# Patient Record
Sex: Male | Born: 1935 | Race: White | Hispanic: No | Marital: Married | Smoking: Former smoker
Health system: Southern US, Community
[De-identification: ages and names within clinical notes are randomized; demographics above are authoritative.]

## PROBLEM LIST (undated history)

## (undated) DIAGNOSIS — I10 Essential (primary) hypertension: Secondary | ICD-10-CM

## (undated) DIAGNOSIS — A498 Other bacterial infections of unspecified site: Secondary | ICD-10-CM

## (undated) DIAGNOSIS — E538 Deficiency of other specified B group vitamins: Secondary | ICD-10-CM

## (undated) DIAGNOSIS — E039 Hypothyroidism, unspecified: Secondary | ICD-10-CM

## (undated) DIAGNOSIS — M1990 Unspecified osteoarthritis, unspecified site: Secondary | ICD-10-CM

## (undated) DIAGNOSIS — E7800 Pure hypercholesterolemia, unspecified: Secondary | ICD-10-CM

## (undated) DIAGNOSIS — C801 Malignant (primary) neoplasm, unspecified: Secondary | ICD-10-CM

## (undated) DIAGNOSIS — K219 Gastro-esophageal reflux disease without esophagitis: Secondary | ICD-10-CM

## (undated) DIAGNOSIS — F32A Depression, unspecified: Secondary | ICD-10-CM

## (undated) DIAGNOSIS — F329 Major depressive disorder, single episode, unspecified: Secondary | ICD-10-CM

## (undated) DIAGNOSIS — B0229 Other postherpetic nervous system involvement: Secondary | ICD-10-CM

## (undated) DIAGNOSIS — B029 Zoster without complications: Secondary | ICD-10-CM

## (undated) HISTORY — PX: OTHER SURGICAL HISTORY: SHX169

## (undated) HISTORY — PX: FECAL TRANSPLANT: SHX6383

## (undated) HISTORY — PX: CORONARY ARTERY BYPASS GRAFT: SHX141

## (undated) HISTORY — PX: FRACTURE SURGERY: SHX138

## (undated) HISTORY — PX: TONSILLECTOMY: SUR1361

---

## 2011-04-23 ENCOUNTER — Ambulatory Visit: Payer: Self-pay | Admitting: Pain Medicine

## 2012-03-28 ENCOUNTER — Ambulatory Visit: Payer: Self-pay | Admitting: Oncology

## 2012-04-25 ENCOUNTER — Ambulatory Visit: Payer: Self-pay | Admitting: Oncology

## 2012-06-30 ENCOUNTER — Ambulatory Visit: Payer: Self-pay | Admitting: Oncology

## 2012-06-30 LAB — CBC CANCER CENTER
Basophil #: 0.1 x10 3/mm (ref 0.0–0.1)
Basophil %: 0.8 %
Eosinophil #: 0.3 x10 3/mm (ref 0.0–0.7)
HCT: 35.8 % — ABNORMAL LOW (ref 40.0–52.0)
Lymphocyte #: 1.3 x10 3/mm (ref 1.0–3.6)
MCH: 28.2 pg (ref 26.0–34.0)
MCV: 86 fL (ref 80–100)
Monocyte #: 0.5 x10 3/mm (ref 0.2–1.0)
Neutrophil %: 70.2 %
Platelet: 236 x10 3/mm (ref 150–440)
RBC: 4.15 10*6/uL — ABNORMAL LOW (ref 4.40–5.90)
RDW: 14.2 % (ref 11.5–14.5)

## 2012-06-30 LAB — BASIC METABOLIC PANEL
Anion Gap: 6 — ABNORMAL LOW (ref 7–16)
BUN: 23 mg/dL — ABNORMAL HIGH (ref 7–18)
EGFR (African American): >60
EGFR (Non-African Amer.): >60
Glucose: 89 mg/dL (ref 65–99)
Osmolality: 283 (ref 275–301)
Potassium: 4.5 mmol/L (ref 3.5–5.1)
Sodium: 140 mmol/L (ref 136–145)

## 2012-07-01 LAB — URINE IEP, RANDOM

## 2012-07-01 LAB — KAPPA/LAMBDA FREE LIGHT CHAINS (ARMC)

## 2012-07-01 LAB — PROT IMMUNOELECTROPHORES(ARMC)

## 2012-07-26 ENCOUNTER — Ambulatory Visit: Payer: Self-pay | Admitting: Oncology

## 2014-05-10 ENCOUNTER — Ambulatory Visit: Payer: Self-pay | Admitting: Otolaryngology

## 2014-05-31 ENCOUNTER — Ambulatory Visit: Payer: Self-pay | Admitting: Otolaryngology

## 2014-06-08 ENCOUNTER — Ambulatory Visit: Payer: Self-pay | Admitting: Family Medicine

## 2014-07-19 DIAGNOSIS — F329 Major depressive disorder, single episode, unspecified: Secondary | ICD-10-CM | POA: Insufficient documentation

## 2014-07-19 DIAGNOSIS — I10 Essential (primary) hypertension: Secondary | ICD-10-CM | POA: Insufficient documentation

## 2014-07-19 DIAGNOSIS — F32A Depression, unspecified: Secondary | ICD-10-CM | POA: Insufficient documentation

## 2014-07-19 DIAGNOSIS — E785 Hyperlipidemia, unspecified: Secondary | ICD-10-CM | POA: Insufficient documentation

## 2014-08-21 DIAGNOSIS — M5136 Other intervertebral disc degeneration, lumbar region: Secondary | ICD-10-CM | POA: Insufficient documentation

## 2014-08-22 DIAGNOSIS — M62830 Muscle spasm of back: Secondary | ICD-10-CM | POA: Insufficient documentation

## 2014-09-17 DIAGNOSIS — J449 Chronic obstructive pulmonary disease, unspecified: Secondary | ICD-10-CM | POA: Insufficient documentation

## 2015-06-20 ENCOUNTER — Encounter: Payer: Self-pay | Admitting: *Deleted

## 2015-06-21 ENCOUNTER — Ambulatory Visit
Admission: RE | Admit: 2015-06-21 | Discharge: 2015-06-21 | Disposition: A | Discharge status: Home / Self Care | Payer: Medicare Other | Source: Ambulatory Visit | Attending: Gastroenterology | Admitting: Gastroenterology

## 2015-06-21 ENCOUNTER — Encounter
Admission: RE | Disposition: A | Discharge status: Home / Self Care | Payer: Self-pay | Source: Ambulatory Visit | Attending: Gastroenterology

## 2015-06-21 ENCOUNTER — Ambulatory Visit: Payer: Medicare Other | Admitting: Anesthesiology

## 2015-06-21 DIAGNOSIS — Z85828 Personal history of other malignant neoplasm of skin: Secondary | ICD-10-CM | POA: Diagnosis not present

## 2015-06-21 DIAGNOSIS — I10 Essential (primary) hypertension: Secondary | ICD-10-CM | POA: Diagnosis not present

## 2015-06-21 DIAGNOSIS — Z951 Presence of aortocoronary bypass graft: Secondary | ICD-10-CM | POA: Diagnosis not present

## 2015-06-21 DIAGNOSIS — E538 Deficiency of other specified B group vitamins: Secondary | ICD-10-CM | POA: Insufficient documentation

## 2015-06-21 DIAGNOSIS — E890 Postprocedural hypothyroidism: Secondary | ICD-10-CM | POA: Diagnosis not present

## 2015-06-21 DIAGNOSIS — Z85819 Personal history of malignant neoplasm of unspecified site of lip, oral cavity, and pharynx: Secondary | ICD-10-CM | POA: Diagnosis not present

## 2015-06-21 DIAGNOSIS — M19071 Primary osteoarthritis, right ankle and foot: Secondary | ICD-10-CM | POA: Insufficient documentation

## 2015-06-21 DIAGNOSIS — E780 Pure hypercholesterolemia: Secondary | ICD-10-CM | POA: Diagnosis not present

## 2015-06-21 DIAGNOSIS — M19072 Primary osteoarthritis, left ankle and foot: Secondary | ICD-10-CM | POA: Insufficient documentation

## 2015-06-21 DIAGNOSIS — K219 Gastro-esophageal reflux disease without esophagitis: Secondary | ICD-10-CM | POA: Insufficient documentation

## 2015-06-21 DIAGNOSIS — Z79899 Other long term (current) drug therapy: Secondary | ICD-10-CM | POA: Insufficient documentation

## 2015-06-21 DIAGNOSIS — Z1211 Encounter for screening for malignant neoplasm of colon: Secondary | ICD-10-CM | POA: Diagnosis present

## 2015-06-21 DIAGNOSIS — Z538 Procedure and treatment not carried out for other reasons: Secondary | ICD-10-CM | POA: Insufficient documentation

## 2015-06-21 DIAGNOSIS — Z87891 Personal history of nicotine dependence: Secondary | ICD-10-CM | POA: Diagnosis not present

## 2015-06-21 DIAGNOSIS — F329 Major depressive disorder, single episode, unspecified: Secondary | ICD-10-CM | POA: Diagnosis not present

## 2015-06-21 DIAGNOSIS — Z885 Allergy status to narcotic agent status: Secondary | ICD-10-CM | POA: Insufficient documentation

## 2015-06-21 HISTORY — DX: Deficiency of other specified B group vitamins: E53.8

## 2015-06-21 HISTORY — DX: Depression, unspecified: F32.A

## 2015-06-21 HISTORY — DX: Unspecified osteoarthritis, unspecified site: M19.90

## 2015-06-21 HISTORY — DX: Malignant (primary) neoplasm, unspecified: C80.1

## 2015-06-21 HISTORY — DX: Zoster without complications: B02.9

## 2015-06-21 HISTORY — DX: Gastro-esophageal reflux disease without esophagitis: K21.9

## 2015-06-21 HISTORY — DX: Essential (primary) hypertension: I10

## 2015-06-21 HISTORY — DX: Other postherpetic nervous system involvement: B02.29

## 2015-06-21 HISTORY — DX: Pure hypercholesterolemia, unspecified: E78.00

## 2015-06-21 HISTORY — DX: Major depressive disorder, single episode, unspecified: F32.9

## 2015-06-21 HISTORY — DX: Hypothyroidism, unspecified: E03.9

## 2015-06-21 SURGERY — COLONOSCOPY WITH PROPOFOL
Anesthesia: General

## 2015-06-21 MED ORDER — PROPOFOL 10 MG/ML IV BOLUS
INTRAVENOUS | Status: DC | PRN
  Administered 2015-06-21: 70 mg via INTRAVENOUS

## 2015-06-21 MED ORDER — EPHEDRINE SULFATE 50 MG/ML IJ SOLN
INTRAMUSCULAR | Status: DC | PRN
  Administered 2015-06-21: 10 mg via INTRAVENOUS

## 2015-06-21 MED ORDER — PROPOFOL INFUSION 10 MG/ML OPTIME
INTRAVENOUS | Status: DC | PRN
  Administered 2015-06-21: 140 ug/kg/min via INTRAVENOUS

## 2015-06-21 MED ORDER — SODIUM CHLORIDE 0.9 % IV SOLN
INTRAVENOUS | Status: DC
  Administered 2015-06-21: 12:00:00 via INTRAVENOUS

## 2015-06-21 MED ORDER — CARVEDILOL 6.25 MG PO TABS
ORAL_TABLET | ORAL | Status: AC
  Administered 2015-06-21: 3.125 mg via ORAL
  Filled 2015-06-21: qty 1

## 2015-06-21 MED ORDER — GLYCOPYRROLATE 0.2 MG/ML IJ SOLN
INTRAMUSCULAR | Status: DC | PRN
  Administered 2015-06-21 (×2): 0.1 mg via INTRAVENOUS

## 2015-06-21 MED ORDER — SODIUM CHLORIDE 0.9 % IV SOLN
INTRAVENOUS | Status: DC
  Administered 2015-06-21 (×2): via INTRAVENOUS

## 2015-06-21 MED ORDER — CARVEDILOL 6.25 MG PO TABS
3.1250 mg | ORAL_TABLET | Freq: Once | ORAL | Status: AC
  Administered 2015-06-21: 3.125 mg via ORAL

## 2015-06-21 NOTE — Transfer of Care (Signed)
Immediate Anesthesia Transfer of Care Note  Patient: Samuel Ortiz  Procedure(s) Performed: Procedure(s): COLONOSCOPY WITH PROPOFOL (N/A)  Patient Location: PACU  Anesthesia Type:general  Level of Consciousness: sedated  Airway & Oxygen Therapy: Patient Spontanous Breathing and Patient connected to nasal cannula oxygen  Post-op Assessment: Report given to RN  Post vital signs: Reviewed and stable  Last Vitals:  Filed Vitals:   06/21/15 1155  BP: 90/46  Pulse: 51  Temp: 35.8 C  Resp: 14    Complications: No apparent anesthesia complications

## 2015-06-21 NOTE — Anesthesia Postprocedure Evaluation (Signed)
  Anesthesia Post-op Note  Patient: Samuel Ortiz  Procedure(s) Performed: Procedure(s): COLONOSCOPY WITH PROPOFOL (N/A)  Anesthesia type:General  Patient location: PACU  Post pain: Pain level controlled  Post assessment: Post-op Vital signs reviewed, Patient's Cardiovascular Status Stable, Respiratory Function Stable, Patent Airway and No signs of Nausea or vomiting  Post vital signs: Reviewed and stable  Last Vitals:  Filed Vitals:   06/21/15 1155  BP: 90/46  Pulse: 51  Temp: 35.8 C  Resp: 14    Level of consciousness: awake, alert  and patient cooperative  Complications: No apparent anesthesia complications

## 2015-06-21 NOTE — Op Note (Signed)
[facility] Gastroenterology Patient Name: Samuel Ortiz Procedure Date: 06/21/2015 11:34 AM MRN: [id number] Account #: [id number] Date of Birth: 1934/10/17 Admit Type: Outpatient Age: 81 Room: [facility] ENDO ROOM 3 Gender: Male Note Status: Finalized Procedure:         Colonoscopy Indications:       Screening for colorectal malignant neoplasm Providers:         Lollie Sails, MD Referring MD:      Shirline Frees (Referring MD) Medicines:         Monitored Anesthesia Care Complications:     No immediate complications. Procedure:         Pre-Anesthesia Assessment:                    - ASA Grade Assessment: III - A patient with severe                     systemic disease.                    After obtaining informed consent, the colonoscope was                     passed under direct vision. Throughout the procedure, the                     patient's blood pressure, pulse, and oxygen saturations                     were monitored continuously. The Colonoscope was                     introduced through the anus with the intention of                     advancing to the cecum. The scope was advanced to the                     sigmoid colon before the procedure was aborted.                     Medications were given. The colonoscopy was extremely                     difficult due to poor bowel prep with stool present. The                     quality of the bowel preparation was poor. Findings:      poor prep, unable to clear adequately for examination.      The digital rectal exam was normal. Impression:        - Preparation of the colon was poor.                    - No specimens collected. Recommendation:    - repeat preparation and reschedule Procedure Code(s): --- Professional ---                    [phone], Sigmoidoscopy, flexible; diagnostic, including                     collection of specimen(s) by brushing or washing, when                     performed  (separate procedure) Diagnosis Code(s): --- Professional ---  V76.51, Special screening for malignant neoplasms of colon CPT copyright 2014 American Medical Association. All rights reserved. The codes documented in this report are preliminary and upon coder review may  be revised to meet current compliance requirements. Lollie Sails, MD 06/21/2015 11:47:45 AM This report has been signed electronically. Number of Addenda: 0 Note Initiated On: 06/21/2015 11:34 AM Total Procedure Duration: 0 hours 1 minute 13 seconds       [facility]

## 2015-06-21 NOTE — H&P (Signed)
Outpatient short stay form Pre-procedure 06/21/2015 11:32 AM Samuel Sails MD  Primary Physician: D C. Virk  Reason for visit:  Colonoscopy  History of present illness:  Patient is a 81 year old male presenting for a screening colonoscopy. He is not sure of the exact date of his last colonoscopy.  He tolerated his prep well. He denies use of any aspirin  products. He takes no anticoagulation medications.    Current facility-administered medications:  .  0.9 %  sodium chloride infusion, , Intravenous, Continuous, Samuel Sails, MD, Last Rate: 20 mL/hr at 06/21/15 1055 .  0.9 %  sodium chloride infusion, , Intravenous, Continuous, Samuel Sails, MD  Prescriptions prior to admission  Medication Sig Dispense Refill Last Dose  . carvedilol (COREG) 3.125 MG tablet Take 3.125 mg by mouth 2 (two) times daily with a meal.   06/19/2015  . ferrous gluconate (FERGON) 324 MG tablet Take 324 mg by mouth daily with breakfast.   06/19/2015  . gabapentin (NEURONTIN) 300 MG capsule Take 300 mg by mouth 2 (two) times daily.   06/21/2015 at 00600  . HYDROcodone-acetaminophen (NORCO/VICODIN) 5-325 MG per tablet Take 1 tablet by mouth every 6 (six) hours as needed for moderate pain.   06/21/2015 at 0600  . levothyroxine (SYNTHROID, LEVOTHROID) 200 MCG tablet Take 200 mcg by mouth daily before breakfast.   06/19/2015  . lisinopril (PRINIVIL,ZESTRIL) 20 MG tablet Take 20 mg by mouth daily.   06/19/2015  . meloxicam (MOBIC) 7.5 MG tablet Take 7.5 mg by mouth daily.   06/19/2015  . omega-3 acid ethyl esters (LOVAZA) 1 G capsule Take 1 g by mouth daily.   06/19/2015  . sertraline (ZOLOFT) 100 MG tablet Take 100 mg by mouth daily.   06/19/2015  . simvastatin (ZOCOR) 20 MG tablet Take 20 mg by mouth daily.   06/19/2015  . Testosterone (AXIRON) 30 MG/ACT SOLN Place onto the skin.     Marland Kitchen vitamin B-12 (CYANOCOBALAMIN) 1000 MCG tablet Take 1,000 mcg by mouth daily.   06/19/2015     Allergies  Allergen Reactions   . Morphine And Related      Past Medical History  Diagnosis Date  . Cancer   . Hypothyroidism   . GERD (gastroesophageal reflux disease)   . Hypertension   . Arthritis   . Depression   . Shingles   . Vitamin B 12 deficiency   . Hypercholesterolemia   . Postherpetic neuralgia     Review of systems:      Physical Exam    Heart and lungs: Regular rate and rhythm, lungs are bilaterally clear    HEENT: Normocephalic atraumatic eyes are anicteric    Other:     Pertinant exam for procedure: Soft nontender nondistended bowel sounds positive normoactive    Planned proceedures: Colonoscopy and indicated procedures I have discussed the risks benefits and complications of procedures to include not limited to bleeding, infection, perforation and the risk of sedation and the patient wishes to proceed.    Samuel Sails, MD Gastroenterology 06/21/2015  11:32 AM

## 2015-06-21 NOTE — Anesthesia Preprocedure Evaluation (Addendum)
Anesthesia Evaluation  Patient identified by MRN, date of birth, ID band Patient awake    Reviewed: Allergy & Precautions, NPO status , Patient's Chart, lab work & pertinent test results  History of Anesthesia Complications Negative for: history of anesthetic complications  Airway Mallampati: III  TM Distance: >3 FB Neck ROM: Full    Dental  (+) Partial Lower, Upper Dentures   Pulmonary former smoker (quit x 54 yrs),          Cardiovascular hypertension, Pt. on medications and Pt. on home beta blockers + CABG     Neuro/Psych Depression  Neuromuscular disease (shingles)    GI/Hepatic GERD-  Medicated and Controlled,  Endo/Other  Hypothyroidism   Renal/GU      Musculoskeletal   Abdominal   Peds  Hematology   Anesthesia Other Findings   Reproductive/Obstetrics                            Anesthesia Physical Anesthesia Plan  ASA: III  Anesthesia Plan: General   Post-op Pain Management:    Induction: Intravenous  Airway Management Planned: Nasal Cannula  Additional Equipment:   Intra-op Plan:   Post-operative Plan:   Informed Consent: I have reviewed the patients History and Physical, chart, labs and discussed the procedure including the risks, benefits and alternatives for the proposed anesthesia with the patient or authorized representative who has indicated his/her understanding and acceptance.     Plan Discussed with:   Anesthesia Plan Comments:         Anesthesia Quick Evaluation

## 2015-06-21 NOTE — OR Nursing (Signed)
Patient procedure canceled due to very poor prep.

## 2015-08-02 ENCOUNTER — Ambulatory Visit: Admission: RE | Admit: 2015-08-02 | Payer: Medicare Other | Source: Ambulatory Visit | Admitting: Gastroenterology

## 2015-08-02 ENCOUNTER — Encounter: Admission: RE | Payer: Self-pay | Source: Ambulatory Visit

## 2015-08-02 SURGERY — COLONOSCOPY WITH PROPOFOL
Anesthesia: General

## 2016-03-21 DIAGNOSIS — I10 Essential (primary) hypertension: Secondary | ICD-10-CM | POA: Insufficient documentation

## 2016-03-21 DIAGNOSIS — J189 Pneumonia, unspecified organism: Secondary | ICD-10-CM | POA: Insufficient documentation

## 2016-03-21 DIAGNOSIS — A419 Sepsis, unspecified organism: Secondary | ICD-10-CM | POA: Insufficient documentation

## 2016-03-22 DIAGNOSIS — Z8719 Personal history of other diseases of the digestive system: Secondary | ICD-10-CM | POA: Insufficient documentation

## 2016-03-25 DIAGNOSIS — R6520 Severe sepsis without septic shock: Secondary | ICD-10-CM

## 2016-03-25 DIAGNOSIS — J189 Pneumonia, unspecified organism: Secondary | ICD-10-CM | POA: Insufficient documentation

## 2016-03-25 DIAGNOSIS — A419 Sepsis, unspecified organism: Secondary | ICD-10-CM | POA: Insufficient documentation

## 2016-03-25 DIAGNOSIS — J181 Lobar pneumonia, unspecified organism: Secondary | ICD-10-CM

## 2016-04-03 ENCOUNTER — Inpatient Hospital Stay
Admission: EM | Admit: 2016-04-03 | Discharge: 2016-04-09 | DRG: 871 | Disposition: A | Discharge status: Skilled Nursing Facility | Payer: Medicare Other | Attending: Internal Medicine | Admitting: Internal Medicine

## 2016-04-03 ENCOUNTER — Emergency Department: Payer: Medicare Other

## 2016-04-03 DIAGNOSIS — R55 Syncope and collapse: Secondary | ICD-10-CM | POA: Diagnosis present

## 2016-04-03 DIAGNOSIS — K219 Gastro-esophageal reflux disease without esophagitis: Secondary | ICD-10-CM | POA: Diagnosis present

## 2016-04-03 DIAGNOSIS — R9431 Abnormal electrocardiogram [ECG] [EKG]: Secondary | ICD-10-CM | POA: Diagnosis present

## 2016-04-03 DIAGNOSIS — Z66 Do not resuscitate: Secondary | ICD-10-CM | POA: Diagnosis present

## 2016-04-03 DIAGNOSIS — E860 Dehydration: Secondary | ICD-10-CM | POA: Diagnosis present

## 2016-04-03 DIAGNOSIS — Z7982 Long term (current) use of aspirin: Secondary | ICD-10-CM | POA: Diagnosis not present

## 2016-04-03 DIAGNOSIS — B0229 Other postherpetic nervous system involvement: Secondary | ICD-10-CM | POA: Diagnosis present

## 2016-04-03 DIAGNOSIS — N179 Acute kidney failure, unspecified: Secondary | ICD-10-CM | POA: Diagnosis present

## 2016-04-03 DIAGNOSIS — E039 Hypothyroidism, unspecified: Secondary | ICD-10-CM

## 2016-04-03 DIAGNOSIS — I959 Hypotension, unspecified: Secondary | ICD-10-CM | POA: Diagnosis not present

## 2016-04-03 DIAGNOSIS — Z79899 Other long term (current) drug therapy: Secondary | ICD-10-CM | POA: Diagnosis not present

## 2016-04-03 DIAGNOSIS — Z85819 Personal history of malignant neoplasm of unspecified site of lip, oral cavity, and pharynx: Secondary | ICD-10-CM

## 2016-04-03 DIAGNOSIS — I248 Other forms of acute ischemic heart disease: Secondary | ICD-10-CM | POA: Diagnosis present

## 2016-04-03 DIAGNOSIS — F329 Major depressive disorder, single episode, unspecified: Secondary | ICD-10-CM | POA: Diagnosis present

## 2016-04-03 DIAGNOSIS — Z515 Encounter for palliative care: Secondary | ICD-10-CM | POA: Diagnosis present

## 2016-04-03 DIAGNOSIS — Z951 Presence of aortocoronary bypass graft: Secondary | ICD-10-CM | POA: Diagnosis not present

## 2016-04-03 DIAGNOSIS — R5381 Other malaise: Secondary | ICD-10-CM | POA: Diagnosis present

## 2016-04-03 DIAGNOSIS — Z87891 Personal history of nicotine dependence: Secondary | ICD-10-CM | POA: Diagnosis not present

## 2016-04-03 DIAGNOSIS — Z923 Personal history of irradiation: Secondary | ICD-10-CM | POA: Diagnosis not present

## 2016-04-03 DIAGNOSIS — A414 Sepsis due to anaerobes: Principal | ICD-10-CM | POA: Diagnosis present

## 2016-04-03 DIAGNOSIS — R778 Other specified abnormalities of plasma proteins: Secondary | ICD-10-CM | POA: Diagnosis present

## 2016-04-03 DIAGNOSIS — I10 Essential (primary) hypertension: Secondary | ICD-10-CM | POA: Diagnosis present

## 2016-04-03 DIAGNOSIS — J690 Pneumonitis due to inhalation of food and vomit: Secondary | ICD-10-CM | POA: Diagnosis present

## 2016-04-03 DIAGNOSIS — A047 Enterocolitis due to Clostridium difficile: Secondary | ICD-10-CM | POA: Diagnosis present

## 2016-04-03 DIAGNOSIS — K922 Gastrointestinal hemorrhage, unspecified: Secondary | ICD-10-CM | POA: Diagnosis present

## 2016-04-03 DIAGNOSIS — M25519 Pain in unspecified shoulder: Secondary | ICD-10-CM

## 2016-04-03 DIAGNOSIS — M19011 Primary osteoarthritis, right shoulder: Secondary | ICD-10-CM | POA: Diagnosis present

## 2016-04-03 DIAGNOSIS — Z85810 Personal history of malignant neoplasm of tongue: Secondary | ICD-10-CM | POA: Diagnosis not present

## 2016-04-03 DIAGNOSIS — E7800 Pure hypercholesterolemia, unspecified: Secondary | ICD-10-CM | POA: Diagnosis present

## 2016-04-03 DIAGNOSIS — R1310 Dysphagia, unspecified: Secondary | ICD-10-CM | POA: Diagnosis present

## 2016-04-03 DIAGNOSIS — A0472 Enterocolitis due to Clostridium difficile, not specified as recurrent: Secondary | ICD-10-CM | POA: Diagnosis present

## 2016-04-03 DIAGNOSIS — B029 Zoster without complications: Secondary | ICD-10-CM

## 2016-04-03 DIAGNOSIS — R296 Repeated falls: Secondary | ICD-10-CM | POA: Diagnosis present

## 2016-04-03 DIAGNOSIS — R7989 Other specified abnormal findings of blood chemistry: Secondary | ICD-10-CM

## 2016-04-03 DIAGNOSIS — R0902 Hypoxemia: Secondary | ICD-10-CM

## 2016-04-03 DIAGNOSIS — J9601 Acute respiratory failure with hypoxia: Secondary | ICD-10-CM | POA: Diagnosis not present

## 2016-04-03 DIAGNOSIS — A419 Sepsis, unspecified organism: Secondary | ICD-10-CM | POA: Diagnosis not present

## 2016-04-03 LAB — COMPREHENSIVE METABOLIC PANEL
ALBUMIN: 3.2 g/dL — AB (ref 3.5–5.0)
ALT: 12 U/L — ABNORMAL LOW (ref 17–63)
ANION GAP: 9 (ref 5–15)
AST: 18 U/L (ref 15–41)
Alkaline Phosphatase: 16 U/L — ABNORMAL LOW (ref 38–126)
BUN: 18 mg/dL (ref 6–20)
CO2: 24 mmol/L (ref 22–32)
Calcium: 7.8 mg/dL — ABNORMAL LOW (ref 8.9–10.3)
Chloride: 103 mmol/L (ref 101–111)
Creatinine, Ser: 1.19 mg/dL (ref 0.61–1.24)
GFR calc Af Amer: >60 mL/min (ref >60)
GFR calc non Af Amer: 56 mL/min — ABNORMAL LOW (ref >60)
GLUCOSE: 107 mg/dL — AB (ref 65–99)
POTASSIUM: 5 mmol/L (ref 3.5–5.1)
SODIUM: 136 mmol/L (ref 135–145)
Total Bilirubin: 0.6 mg/dL (ref 0.3–1.2)
Total Protein: 7.3 g/dL (ref 6.5–8.1)

## 2016-04-03 LAB — CBC WITH DIFFERENTIAL/PLATELET
Basophils Absolute: 0 10*3/uL (ref 0–0.1)
Basophils Relative: 0 %
Eosinophils Absolute: 0.1 10*3/uL (ref 0–0.7)
Eosinophils Relative: 1 %
HEMATOCRIT: 36.1 % — AB (ref 40.0–52.0)
Hemoglobin: 11.8 g/dL — ABNORMAL LOW (ref 13.0–18.0)
Lymphs Abs: 0.6 10*3/uL — ABNORMAL LOW (ref 1.0–3.6)
MCH: 28 pg (ref 26.0–34.0)
MCHC: 32.6 g/dL (ref 32.0–36.0)
MCV: 85.9 fL (ref 80.0–100.0)
MONO ABS: 0.7 10*3/uL (ref 0.2–1.0)
NEUTROS ABS: 21.1 10*3/uL — AB (ref 1.4–6.5)
Neutrophils Relative %: 93 %
Platelets: 418 10*3/uL (ref 150–440)
RBC: 4.21 MIL/uL — ABNORMAL LOW (ref 4.40–5.90)
RDW: 13.6 % (ref 11.5–14.5)
WBC: 22.6 10*3/uL — ABNORMAL HIGH (ref 3.8–10.6)

## 2016-04-03 LAB — GASTROINTESTINAL PANEL BY PCR, STOOL (REPLACES STOOL CULTURE)
ADENOVIRUS F40/41: NOT DETECTED
ASTROVIRUS: NOT DETECTED
Campylobacter species: NOT DETECTED
Cryptosporidium: NOT DETECTED
Cyclospora cayetanensis: NOT DETECTED
E. COLI O157: NOT DETECTED
ENTAMOEBA HISTOLYTICA: NOT DETECTED
ENTEROPATHOGENIC E COLI (EPEC): NOT DETECTED
ENTEROTOXIGENIC E COLI (ETEC): NOT DETECTED
Enteroaggregative E coli (EAEC): NOT DETECTED
Giardia lamblia: NOT DETECTED
NOROVIRUS GI/GII: NOT DETECTED
Plesimonas shigelloides: NOT DETECTED
Rotavirus A: NOT DETECTED
SAPOVIRUS (I, II, IV, AND V): NOT DETECTED
SHIGA LIKE TOXIN PRODUCING E COLI (STEC): NOT DETECTED
Salmonella species: NOT DETECTED
Shigella/Enteroinvasive E coli (EIEC): NOT DETECTED
VIBRIO CHOLERAE: NOT DETECTED
Vibrio species: NOT DETECTED
Yersinia enterocolitica: NOT DETECTED

## 2016-04-03 LAB — TROPONIN I
Troponin I: 0.05 ng/mL — ABNORMAL HIGH (ref <0.031)
Troponin I: 0.07 ng/mL — ABNORMAL HIGH (ref <0.031)

## 2016-04-03 LAB — LACTIC ACID, PLASMA
LACTIC ACID, VENOUS: 1 mmol/L (ref 0.5–2.0)
Lactic Acid, Venous: 1.3 mmol/L (ref 0.5–2.0)

## 2016-04-03 LAB — MRSA PCR SCREENING: MRSA BY PCR: NEGATIVE

## 2016-04-03 LAB — HEMOGLOBIN AND HEMATOCRIT, BLOOD
HEMATOCRIT: 37.8 % — AB (ref 40.0–52.0)
HEMOGLOBIN: 12.2 g/dL — AB (ref 13.0–18.0)

## 2016-04-03 LAB — C DIFFICILE QUICK SCREEN W PCR REFLEX
C DIFFICILE (CDIFF) INTERP: POSITIVE
C DIFFICILE (CDIFF) TOXIN: POSITIVE — AB
C Diff antigen: POSITIVE — AB

## 2016-04-03 LAB — GLUCOSE, CAPILLARY: GLUCOSE-CAPILLARY: 94 mg/dL (ref 65–99)

## 2016-04-03 LAB — BRAIN NATRIURETIC PEPTIDE: B Natriuretic Peptide: 374 pg/mL — ABNORMAL HIGH (ref 0.0–100.0)

## 2016-04-03 MED ORDER — HYDROCODONE-ACETAMINOPHEN 5-325 MG PO TABS
1.0000 | ORAL_TABLET | Freq: Four times a day (QID) | ORAL | Status: DC | PRN
  Administered 2016-04-05 – 2016-04-09 (×10): 1 via ORAL
  Filled 2016-04-03 (×10): qty 1

## 2016-04-03 MED ORDER — GABAPENTIN 300 MG PO CAPS
900.0000 mg | ORAL_CAPSULE | Freq: Three times a day (TID) | ORAL | Status: DC
  Administered 2016-04-03: 900 mg via ORAL
  Filled 2016-04-03: qty 3

## 2016-04-03 MED ORDER — LISINOPRIL 20 MG PO TABS
20.0000 mg | ORAL_TABLET | Freq: Every day | ORAL | Status: DC
  Filled 2016-04-03: qty 1

## 2016-04-03 MED ORDER — METRONIDAZOLE 250 MG PO TABS
250.0000 mg | ORAL_TABLET | Freq: Three times a day (TID) | ORAL | Status: DC
  Administered 2016-04-04: 250 mg via ORAL
  Filled 2016-04-03 (×2): qty 1

## 2016-04-03 MED ORDER — LEVOTHYROXINE SODIUM 75 MCG PO TABS
200.0000 ug | ORAL_TABLET | Freq: Every day | ORAL | Status: DC
  Administered 2016-04-04 – 2016-04-09 (×6): 200 ug via ORAL
  Filled 2016-04-03 (×2): qty 2
  Filled 2016-04-03 (×4): qty 1

## 2016-04-03 MED ORDER — PIPERACILLIN-TAZOBACTAM 3.375 G IVPB 30 MIN
3.3750 g | Freq: Once | INTRAVENOUS | Status: AC
  Administered 2016-04-03: 3.375 g via INTRAVENOUS
  Filled 2016-04-03: qty 50

## 2016-04-03 MED ORDER — ACETAMINOPHEN 325 MG PO TABS
650.0000 mg | ORAL_TABLET | Freq: Four times a day (QID) | ORAL | Status: DC | PRN
  Administered 2016-04-04: 650 mg via ORAL
  Filled 2016-04-03: qty 2
  Filled 2016-04-03: qty 1

## 2016-04-03 MED ORDER — ONDANSETRON HCL 4 MG/2ML IJ SOLN: 4.0000 mg | Freq: Four times a day (QID) | INTRAMUSCULAR | Status: DC | PRN

## 2016-04-03 MED ORDER — SODIUM CHLORIDE 0.9 % IV BOLUS (SEPSIS)
500.0000 mL | Freq: Once | INTRAVENOUS | Status: AC
  Administered 2016-04-03: 500 mL via INTRAVENOUS

## 2016-04-03 MED ORDER — CARVEDILOL 3.125 MG PO TABS
3.1250 mg | ORAL_TABLET | Freq: Two times a day (BID) | ORAL | Status: DC
  Filled 2016-04-03: qty 1

## 2016-04-03 MED ORDER — ACETAMINOPHEN 650 MG RE SUPP: 650.0000 mg | Freq: Four times a day (QID) | RECTAL | Status: DC | PRN

## 2016-04-03 MED ORDER — HYDROCODONE-ACETAMINOPHEN 5-325 MG PO TABS
1.0000 | ORAL_TABLET | Freq: Once | ORAL | Status: AC
  Administered 2016-04-03: 1 via ORAL
  Filled 2016-04-03: qty 1

## 2016-04-03 MED ORDER — SODIUM CHLORIDE 0.9% FLUSH
3.0000 mL | Freq: Two times a day (BID) | INTRAVENOUS | Status: DC
Start: 2016-04-03 — End: 2016-04-09
  Administered 2016-04-03 – 2016-04-09 (×11): 3 mL via INTRAVENOUS

## 2016-04-03 MED ORDER — IPRATROPIUM-ALBUTEROL 0.5-2.5 (3) MG/3ML IN SOLN: 3.0000 mL | RESPIRATORY_TRACT | Status: DC | PRN

## 2016-04-03 MED ORDER — METRONIDAZOLE 500 MG PO TABS
500.0000 mg | ORAL_TABLET | Freq: Three times a day (TID) | ORAL | Status: DC
  Filled 2016-04-03: qty 1

## 2016-04-03 MED ORDER — GABAPENTIN 300 MG PO CAPS
900.0000 mg | ORAL_CAPSULE | Freq: Three times a day (TID) | ORAL | Status: DC
  Administered 2016-04-04 – 2016-04-09 (×17): 900 mg via ORAL
  Filled 2016-04-03 (×17): qty 3

## 2016-04-03 MED ORDER — VANCOMYCIN 50 MG/ML ORAL SOLUTION
125.0000 mg | Freq: Four times a day (QID) | ORAL | Status: DC
  Administered 2016-04-04 (×2): 125 mg via ORAL
  Filled 2016-04-03 (×3): qty 2.5

## 2016-04-03 MED ORDER — SERTRALINE HCL 100 MG PO TABS
100.0000 mg | ORAL_TABLET | Freq: Every day | ORAL | Status: DC
  Administered 2016-04-04 – 2016-04-09 (×6): 100 mg via ORAL
  Filled 2016-04-03 (×6): qty 1

## 2016-04-03 MED ORDER — FAMOTIDINE IN NACL 20-0.9 MG/50ML-% IV SOLN
20.0000 mg | Freq: Two times a day (BID) | INTRAVENOUS | Status: DC
  Administered 2016-04-03 – 2016-04-07 (×8): 20 mg via INTRAVENOUS
  Filled 2016-04-03 (×10): qty 50

## 2016-04-03 MED ORDER — ONDANSETRON HCL 4 MG PO TABS: 4.0000 mg | ORAL_TABLET | Freq: Four times a day (QID) | ORAL | Status: DC | PRN

## 2016-04-03 MED ORDER — SENNOSIDES-DOCUSATE SODIUM 8.6-50 MG PO TABS: 1.0000 | ORAL_TABLET | Freq: Every evening | ORAL | Status: DC | PRN

## 2016-04-03 MED ORDER — SODIUM CHLORIDE 0.9 % IV SOLN
INTRAVENOUS | Status: DC
  Administered 2016-04-03 – 2016-04-04 (×2): via INTRAVENOUS
  Administered 2016-04-05: 50 mL/h via INTRAVENOUS

## 2016-04-03 NOTE — ED Provider Notes (Signed)
[facility] Emergency Department Provider Note   ____________________________________________  Time seen: Approximately 7:03 PM  I have reviewed the triage vital signs and the nursing notes.   HISTORY  Chief Complaint Fall   HPI Samuel Ortiz is a 82 y.o. male patient got up from bed rapidly and fell down. He thinks he got lightheaded when he stood up. He did not hit his head. He did not pass out. Patient was recently discharged from the hospital after being treated for pneumonia. His O2 sats were 88 on room air here. He is not usually on oxygen. He says he feels fine except for his posterior palate neuralgia on his back. He says he takes Neurontin 900 mg 3 times a day with Dr. Marygrace Drought one pill 3 times a day for this pain. Patient denies any other pain and headache nausea vomiting coughing etc.   Past Medical History  Diagnosis Date  . Cancer ([place])   . Hypothyroidism   . GERD (gastroesophageal reflux disease)   . Hypertension   . Arthritis   . Depression   . Shingles   . Vitamin B 12 deficiency   . Hypercholesterolemia   . Postherpetic neuralgia     There are no active problems to display for this patient.   Past Surgical History  Procedure Laterality Date  . Tonsillectomy    . Coronary artery bypass graft    . Throat cancer      resection of cancer  . Fracture surgery    . Orif femur fraction Right     Current Outpatient Rx  Name  Route  Sig  Dispense  Refill  . albuterol (PROVENTIL HFA;VENTOLIN HFA) 108 (90 Base) MCG/ACT inhaler   Inhalation   Inhale 2 puffs into the lungs every 6 (six) hours as needed for wheezing or shortness of breath.         . carvedilol (COREG) 3.125 MG tablet   Oral   Take 3.125 mg by mouth 2 (two) times daily with a meal.         . ferrous gluconate (FERGON) 324 MG tablet   Oral   Take 324 mg by mouth daily with breakfast.         . gabapentin (NEURONTIN) 300 MG capsule   Oral   Take 900 mg  by mouth 3 (three) times daily.          Marland Kitchen HYDROcodone-acetaminophen (NORCO/VICODIN) 5-325 MG per tablet   Oral   Take 1 tablet by mouth every 6 (six) hours as needed for moderate pain.         Marland Kitchen levothyroxine (SYNTHROID, LEVOTHROID) 200 MCG tablet   Oral   Take 200 mcg by mouth daily before breakfast.         . lisinopril (PRINIVIL,ZESTRIL) 20 MG tablet   Oral   Take 20 mg by mouth daily.         . meloxicam (MOBIC) 7.5 MG tablet   Oral   Take 7.5 mg by mouth daily.         Marland Kitchen omega-3 acid ethyl esters (LOVAZA) 1 G capsule   Oral   Take 1 g by mouth daily.         . sertraline (ZOLOFT) 100 MG tablet   Oral   Take 100 mg by mouth daily.         . simvastatin (ZOCOR) 20 MG tablet   Oral   Take 20 mg by mouth at bedtime.          Marland Kitchen  vitamin B-12 (CYANOCOBALAMIN) 1000 MCG tablet   Oral   Take 1,000 mcg by mouth daily.           Allergies Morphine and related  No family history on file.  Social History Social History  Substance Use Topics  . Smoking status: Former Smoker    Quit date: 03/19/1961  . Smokeless tobacco: None  . Alcohol Use: No    Review of Systems Constitutional: No fever/chills Eyes: No visual changes. ENT: No sore throat. Cardiovascular: Denies chest pain. Respiratory: Denies shortness of breath! Gastrointestinal: No abdominal pain.  No nausea, no vomiting.  No diarrhea.  No constipation. Genitourinary: Negative for dysuria. Musculoskeletal: Negative for back pain. Skin: Negative for rash. Neurological: Negative for headaches, focal weakness or numbness.  10-point ROS otherwise negative.  ____________________________________________   PHYSICAL EXAM:  VITAL SIGNS: ED Triage Vitals  Enc Vitals Group     BP 04/03/16 1849 108/64 mmHg     Pulse Rate 04/03/16 1849 83     Resp 04/03/16 1849 16     Temp --      Temp src --      SpO2 04/03/16 1849 93 %     Weight 04/03/16 1849 230 lb (104.327 kg)     Height 04/03/16  1849 6' (1.829 m)     Head Cir --      Peak Flow --      Pain Score --      Pain Loc --      Pain Edu? --      Excl. in [place]? --     Constitutional: Alert and oriented. Well appearing and in no acute distress. Eyes: Conjunctivae are normal. PERRL. EOMI. Head: Atraumatic. Nose: No congestion/rhinnorhea. Mouth/Throat: Mucous membranes are moist.  Oropharynx non-erythematous. Neck: No stridor.  Cardiovascular: Normal rate, regular rhythm. Grossly normal heart sounds.  Good peripheral circulation. Respiratory: Normal respiratory effort.  No retractions. Lungs CTAB. Gastrointestinal: Soft and nontender. No distention. No abdominal bruits. No CVA tenderness. }Musculoskeletal: No lower extremity tenderness nor edema.  No joint effusions. Neurologic:  Normal speech and language. No gross focal neurologic deficits are appreciated. No gait instability. Skin:  Skin is warm, dry and intact. No rash noted. Psychiatric: Mood and affect are normal. Speech and behavior are normal.  ____________________________________________   LABS (all labs ordered are listed, but only abnormal results are displayed)  Labs Reviewed  COMPREHENSIVE METABOLIC PANEL - Abnormal; Notable for the following:    Glucose, Bld 107 (*)    Calcium 7.8 (*)    Albumin 3.2 (*)    ALT 12 (*)    Alkaline Phosphatase 16 (*)    GFR calc non Af Amer 56 (*)    All other components within normal limits  CBC WITH DIFFERENTIAL/PLATELET - Abnormal; Notable for the following:    WBC 22.6 (*)    RBC 4.21 (*)    Hemoglobin 11.8 (*)    HCT 36.1 (*)    Neutro Abs 21.1 (*)    Lymphs Abs 0.6 (*)    All other components within normal limits  BRAIN NATRIURETIC PEPTIDE - Abnormal; Notable for the following:    B Natriuretic Peptide 374.0 (*)    All other components within normal limits  TROPONIN I - Abnormal; Notable for the following:    Troponin I 0.05 (*)    All other components within normal limits  URINE CULTURE  CULTURE, BLOOD  (ROUTINE X 2)  CULTURE, BLOOD (ROUTINE X 2)  URINALYSIS COMPLETEWITH  MICROSCOPIC ([facility] ONLY)  LACTIC ACID, PLASMA  LACTIC ACID, PLASMA   ____________________________________________  EKG  EKG read and interpreted by me shows normal sinus rhythm rate of 78 slight left axis in Q waves inferiorly and laterally no acute ST-T wave changes though ____________________________________________  RADIOLOGY  Right-sided pneumonia per radiology ____________________________________________   PROCEDURES  Patient has been having diarrhea since leaving [facility] and antibiotics. He is currently leaking diarrheas he tries to get to the toilet. He is very weak and having a lot of difficulty walking. He has also been vomiting up black tarry material at home. ____________________________________________   INITIAL IMPRESSION / ASSESSMENT AND PLAN / ED COURSE  Pertinent labs & imaging results that were available during my care of the patient were reviewed by me and considered in my medical decision making (see chart for details). Patient's daughter says the home situation is not safe for him. He may have a little bit of dementia. Patient has an elevated troponin and abnormal EKG and at least near syncope. We will admit him.  ______   Patient's daughter reports that for the last year or so patient has been acting depressed saying he was waiting to die. He is not taking care of himself. He was not getting out of bed especially here in the last week or so he has fired his physical therapist occupational therapist and an speech therapist. He did allow them to 2 of them to come to the house once before he fired them. He says he knows everything already. Per his daughter.  Patient's daughter also reports his wife is old and cannot longer take care of him in his current condition. ______________________________________   FINAL CLINICAL IMPRESSION(S) / ED DIAGNOSES  Final diagnoses:  Hypoxia    Elevated troponin  Abnormal EKG  Near syncope      NEW MEDICATIONS STARTED DURING THIS VISIT:  New Prescriptions   No medications on file     Note:  This document was prepared using Dragon voice recognition software and may include unintentional dictation errors.    Nena Polio, MD 04/03/16 2026

## 2016-04-03 NOTE — H&P (Signed)
PCP:   Sherrin Daisy, MD   Chief Complaint:  Diarrhea  HPI: This is a 82 year old male who was recently at [facility] with pneumonia thought to be aspiration pneumonia. He was treated with three IV antibiotics inpatient and at discharge had some diarrhea. The patient was admitted at [facility] for 5 days. At home his diarrhea continued, there was a brief period of improvement but he has rebounded and he has 3-4 movements a day. He is weak and having lots of accidents. He is also had little liquid intake by mouth. He denies any abdominal pain. Today he started with nausea and vomiting, his emesis was black. He had 3 episodes of black emesis today. He denies GERD or epigastric pain. He states he takes aspirin daily, on reviewing his meds he also has meloxicam ordered. He denies spicy foods. Per family prior to his hospital admission [place] he also had some black emesis but this resolved during his hospital stay. The patient has a history of throat cancer and is status post radiation. He has dysphagia and hypothyroidism from his radiation. He is pured diet but thin liquids. He is becoming more deconditioned and not moving around much. Per family he is very depressed but patient denies. His wife is his primary caretaker, she seems overwhelmed as does his daughter by the patient's progressive debility and now almost total care needs. They wish social service consult and possibly temporary or permanent placement in a nursing home with rehabilitation. The patient was able to partake in some of the discussion. In the ER the patient is hypotensive with systolic blood pressures in the 80s. He is also febrile. He is receiving IV fluid boluses.  Review of Systems:  The patient denies anorexia, fever, weight loss,, vision loss, decreased hearing, hoarseness, chest pain, syncope, dyspnea on exertion, peripheral edema, balance deficits, hemoptysis, nausea and vomiting, diarrhea, abdominal pain, melena,  hematochezia, severe indigestion/heartburn, hematuria, incontinence, genital sores, muscle weakness, suspicious skin lesions, transient blindness, difficulty walking, depression, unusual weight change, abnormal bleeding, enlarged lymph nodes, angioedema, and breast masses.  Past Medical History: Past Medical History  Diagnosis Date  . Cancer ([place])   . Hypothyroidism   . GERD (gastroesophageal reflux disease)   . Hypertension   . Arthritis   . Depression   . Shingles   . Vitamin B 12 deficiency   . Hypercholesterolemia   . Postherpetic neuralgia    Past Surgical History  Procedure Laterality Date  . Tonsillectomy    . Coronary artery bypass graft    . Throat cancer      resection of cancer  . Fracture surgery    . Orif femur fraction Right     Medications: Prior to Admission medications   Medication Sig Start Date End Date Taking? Authorizing Provider  albuterol (PROVENTIL HFA;VENTOLIN HFA) 108 (90 Base) MCG/ACT inhaler Inhale 2 puffs into the lungs every 6 (six) hours as needed for wheezing or shortness of breath.   Yes Historical Provider, MD  carvedilol (COREG) 3.125 MG tablet Take 3.125 mg by mouth 2 (two) times daily with a meal.   Yes Historical Provider, MD  ferrous gluconate (FERGON) 324 MG tablet Take 324 mg by mouth daily with breakfast.   Yes Historical Provider, MD  gabapentin (NEURONTIN) 300 MG capsule Take 900 mg by mouth 3 (three) times daily.    Yes Historical Provider, MD  HYDROcodone-acetaminophen (NORCO/VICODIN) 5-325 MG per tablet Take 1 tablet by mouth every 6 (six) hours as needed for moderate pain.  Yes Historical Provider, MD  levothyroxine (SYNTHROID, LEVOTHROID) 200 MCG tablet Take 200 mcg by mouth daily before breakfast.   Yes Historical Provider, MD  lisinopril (PRINIVIL,ZESTRIL) 20 MG tablet Take 20 mg by mouth daily.   Yes Historical Provider, MD  meloxicam (MOBIC) 7.5 MG tablet Take 7.5 mg by mouth daily.   Yes Historical Provider, MD  omega-3 acid  ethyl esters (LOVAZA) 1 G capsule Take 1 g by mouth daily.   Yes Historical Provider, MD  sertraline (ZOLOFT) 100 MG tablet Take 100 mg by mouth daily.   Yes Historical Provider, MD  simvastatin (ZOCOR) 20 MG tablet Take 20 mg by mouth at bedtime.    Yes Historical Provider, MD  vitamin B-12 (CYANOCOBALAMIN) 1000 MCG tablet Take 1,000 mcg by mouth daily.   Yes Historical Provider, MD    Allergies:   Allergies  Allergen Reactions  . Morphine And Related Other (See Comments)    Reaction:  Hallucinations     Social History:  reports that he quit smoking about 55 years ago. He does not have any smokeless tobacco history on file. He reports that he does not drink alcohol or use illicit drugs.  Family History: No family history on file.  Physical Exam: Filed Vitals:   04/03/16 1849 04/03/16 2029 04/03/16 2044 04/03/16 2113  BP: 108/64  115/67 84/60  Pulse: 83  71 71  Temp:  98.7 F (37.1 C)    TempSrc:  Oral    Resp: 16  18 20   Height: 6' (1.829 m)     Weight: 104.327 kg (230 lb)     SpO2: 93%  96% 96%    General:  Alert and oriented times three, well developed and nourished, no acute distress Eyes: PERRLA, pink conjunctiva, no scleral icterus ENT: Moist oral mucosa, neck supple, no thyromegaly Lungs: clear to ascultation, no wheeze, no crackles, no use of accessory muscles Cardiovascular: regular rate and rhythm, no regurgitation, no gallops, no murmurs. No carotid bruits, no JVD Abdomen: soft, positive BS, non-tender, non-distended, no organomegaly, not an acute abdomen GU: not examined Neuro: CN II - XII grossly intact, sensation intact Musculoskeletal: strength 5/5 all extremities, no clubbing, cyanosis or edema Skin: no rash, no subcutaneous crepitation, no decubitus Psych: appropriate patient   Labs on Admission:   Recent Labs  04/03/16 1902  NA 136  K 5.0  CL 103  CO2 24  GLUCOSE 107*  BUN 18  CREATININE 1.19  CALCIUM 7.8*    Recent Labs  04/03/16 1902   AST 18  ALT 12*  ALKPHOS 16*  BILITOT 0.6  PROT 7.3  ALBUMIN 3.2*   No results for input(s): LIPASE, AMYLASE in the last 72 hours.  Recent Labs  04/03/16 1902  WBC 22.6*  NEUTROABS 21.1*  HGB 11.8*  HCT 36.1*  MCV 85.9  PLT 418    Recent Labs  04/03/16 1902  TROPONINI 0.05*   Invalid input(s): POCBNP No results for input(s): DDIMER in the last 72 hours. No results for input(s): HGBA1C in the last 72 hours. No results for input(s): CHOL, HDL, LDLCALC, TRIG, CHOLHDL, LDLDIRECT in the last 72 hours. No results for input(s): TSH, T4TOTAL, T3FREE, THYROIDAB in the last 72 hours.  Invalid input(s): FREET3 No results for input(s): VITAMINB12, FOLATE, FERRITIN, TIBC, IRON, RETICCTPCT in the last 72 hours.  Micro Results: Recent Results (from the past 240 hour(s))  C difficile quick scan w PCR reflex     Status: Abnormal   Collection Time: 04/03/16  8:25 PM  Result Value Ref Range Status   C Diff antigen POSITIVE (A) NEGATIVE Final   C Diff toxin POSITIVE (A) NEGATIVE Final   C Diff interpretation   Final    Positive for toxigenic C. difficile, active toxin production present.    Comment: CRITICAL RESULT CALLED TO, READ BACK BY AND VERIFIED WITH: [facility] WOODS AT 2128 04/03/16 MLZ       Radiological Exams on Admission: Dg Chest 2 View  04/03/2016  CLINICAL DATA:  Recent fall with hypoxia EXAM: CHEST  2 VIEW COMPARISON:  06/30/2012 FINDINGS: Cardiac shadow is mildly enlarged but stable. Postsurgical changes are again seen. The lungs are well aerated bilaterally. There is a somewhat rounded density identified in the right lung base which likely represents a confluence of infiltrative changes in the right lower and right middle lobe given the changes on the lateral projection. Some prior rib fractures with healing are noted. Postsurgical changes are noted in the left hilar region as well. IMPRESSION: Patchy changes in the right base which appear to be related to right middle and  right lower lobe infiltrate when evaluated on the lateral projection. Electronically Signed   By: Inez Catalina M.D.   On: 04/03/2016 19:53    Assessment/Plan Present on Admission:  . Hypotension -Admit to stepdown, hypotension likely due to dehydration from his nausea and diarrhea. -IV fluid hydration  . C. difficile diarrhea - Vancomycin and Flagyl by mouth both ordered  . GI bleed -Patient is nothing by mouth, IV Pepcid ordered twice a day -GI consult requested. -Serial H&H every 8 hours ordered -Occult blood in stool ordered  . Syncope -Likely due to hypotension. Aggressively fluid rehydrating  . Elevated troponin -Likely due to ischemic demand from hypotension. We'll cycle cardiac enzymes. No further interventions now.  . Physical deconditioning -Education officer, museum consult, PT OT and possible placement at discharge  Aspiration pneumonia -Nothing by mouth, speech consulted for swelling evaluation. Patient's chest x-ray shows residual evidence of pneumonia. Patient could be aspirating on thin liquids. IV antibiotics not ordered -Blood cultures 2 ordered  H/o throat cancer s/p radiation -Aware  hypothyroidsism -Stable, home medications resumed -TSH ordered  Dysphagia -Swelling evaluation requested    Britiany Silbernagel 04/03/2016, 9:41 PM

## 2016-04-03 NOTE — ED Notes (Signed)
Pt came to ED via EMS from home c/o fall. Pt reports got out of his bed quickly and fell. No LOC. Denies pain. Recently discharged from [facility] for pneumonia. Satting 88% r/a

## 2016-04-04 DIAGNOSIS — I959 Hypotension, unspecified: Secondary | ICD-10-CM

## 2016-04-04 DIAGNOSIS — A047 Enterocolitis due to Clostridium difficile: Secondary | ICD-10-CM

## 2016-04-04 LAB — BASIC METABOLIC PANEL
Anion gap: 7 (ref 5–15)
Anion gap: 6 (ref 5–15)
BUN: 21 mg/dL — AB (ref 6–20)
BUN: 21 mg/dL — AB (ref 6–20)
CHLORIDE: 109 mmol/L (ref 101–111)
CO2: 25 mmol/L (ref 22–32)
CO2: 20 mmol/L — ABNORMAL LOW (ref 22–32)
CREATININE: 1.34 mg/dL — AB (ref 0.61–1.24)
CREATININE: 1.22 mg/dL (ref 0.61–1.24)
Calcium: 7.4 mg/dL — ABNORMAL LOW (ref 8.9–10.3)
Calcium: 7 mg/dL — ABNORMAL LOW (ref 8.9–10.3)
Chloride: 104 mmol/L (ref 101–111)
GFR calc Af Amer: >60 mL/min (ref >60)
GFR calc non Af Amer: 55 mL/min — ABNORMAL LOW (ref >60)
GFR, EST AFRICAN AMERICAN: 56 mL/min — AB (ref >60)
GFR, EST NON AFRICAN AMERICAN: 49 mL/min — AB (ref >60)
GLUCOSE: 116 mg/dL — AB (ref 65–99)
Glucose, Bld: 121 mg/dL — ABNORMAL HIGH (ref 65–99)
POTASSIUM: 5 mmol/L (ref 3.5–5.1)
POTASSIUM: 4.4 mmol/L (ref 3.5–5.1)
SODIUM: 136 mmol/L (ref 135–145)
Sodium: 135 mmol/L (ref 135–145)

## 2016-04-04 LAB — URINALYSIS COMPLETE WITH MICROSCOPIC (ARMC ONLY)
BACTERIA UA: NONE SEEN
Bilirubin Urine: NEGATIVE
Glucose, UA: NEGATIVE mg/dL
Hgb urine dipstick: NEGATIVE
Ketones, ur: NEGATIVE mg/dL
LEUKOCYTES UA: NEGATIVE
Nitrite: NEGATIVE
PROTEIN: NEGATIVE mg/dL
SPECIFIC GRAVITY, URINE: 1.018 (ref 1.005–1.030)
pH: 5 (ref 5.0–8.0)

## 2016-04-04 LAB — TROPONIN I
TROPONIN I: 0.06 ng/mL — AB (ref <0.031)
Troponin I: 0.06 ng/mL — ABNORMAL HIGH (ref <0.031)

## 2016-04-04 LAB — CBC
HCT: 32 % — ABNORMAL LOW (ref 40.0–52.0)
Hemoglobin: 10.4 g/dL — ABNORMAL LOW (ref 13.0–18.0)
MCH: 27.9 pg (ref 26.0–34.0)
MCHC: 32.4 g/dL (ref 32.0–36.0)
MCV: 86.1 fL (ref 80.0–100.0)
PLATELETS: 372 10*3/uL (ref 150–440)
RBC: 3.72 MIL/uL — AB (ref 4.40–5.90)
RDW: 13.7 % (ref 11.5–14.5)
WBC: 26.4 10*3/uL — AB (ref 3.8–10.6)

## 2016-04-04 LAB — HEMOGLOBIN AND HEMATOCRIT, BLOOD
HCT: 32.2 % — ABNORMAL LOW (ref 40.0–52.0)
HCT: 31.9 % — ABNORMAL LOW (ref 40.0–52.0)
HEMATOCRIT: 32.9 % — AB (ref 40.0–52.0)
HEMOGLOBIN: 10.8 g/dL — AB (ref 13.0–18.0)
Hemoglobin: 10.7 g/dL — ABNORMAL LOW (ref 13.0–18.0)
Hemoglobin: 10.2 g/dL — ABNORMAL LOW (ref 13.0–18.0)

## 2016-04-04 LAB — URINE DRUG SCREEN, QUALITATIVE (ARMC ONLY)
AMPHETAMINES, UR SCREEN: NOT DETECTED
BARBITURATES, UR SCREEN: NOT DETECTED
BENZODIAZEPINE, UR SCRN: NOT DETECTED
Cannabinoid 50 Ng, Ur ~~LOC~~: NOT DETECTED
Cocaine Metabolite,Ur ~~LOC~~: NOT DETECTED
MDMA (ECSTASY) UR SCREEN: NOT DETECTED
METHADONE SCREEN, URINE: NOT DETECTED
Opiate, Ur Screen: POSITIVE — AB
PHENCYCLIDINE (PCP) UR S: NOT DETECTED
TRICYCLIC, UR SCREEN: NOT DETECTED

## 2016-04-04 LAB — GLUCOSE, CAPILLARY
GLUCOSE-CAPILLARY: 119 mg/dL — AB (ref 65–99)
Glucose-Capillary: 109 mg/dL — ABNORMAL HIGH (ref 65–99)

## 2016-04-04 LAB — TSH: TSH: 1.853 u[IU]/mL (ref 0.350–4.500)

## 2016-04-04 LAB — PHOSPHORUS: Phosphorus: 2.7 mg/dL (ref 2.5–4.6)

## 2016-04-04 LAB — MAGNESIUM: Magnesium: 1.5 mg/dL — ABNORMAL LOW (ref 1.7–2.4)

## 2016-04-04 MED ORDER — SODIUM CHLORIDE 0.9 % IV BOLUS (SEPSIS)
500.0000 mL | Freq: Once | INTRAVENOUS | Status: AC
  Administered 2016-04-04: 500 mL via INTRAVENOUS

## 2016-04-04 MED ORDER — NOREPINEPHRINE 4 MG/250ML-% IV SOLN: 2.0000 ug/min | INTRAVENOUS | Status: DC

## 2016-04-04 MED ORDER — PANTOPRAZOLE SODIUM 40 MG PO TBEC
40.0000 mg | DELAYED_RELEASE_TABLET | Freq: Every day | ORAL | Status: DC
  Administered 2016-04-04 – 2016-04-08 (×5): 40 mg via ORAL
  Filled 2016-04-04 (×5): qty 1

## 2016-04-04 MED ORDER — HEPARIN SODIUM (PORCINE) 5000 UNIT/ML IJ SOLN
5000.0000 [IU] | Freq: Three times a day (TID) | INTRAMUSCULAR | Status: DC
  Administered 2016-04-04 – 2016-04-06 (×5): 5000 [IU] via SUBCUTANEOUS
  Filled 2016-04-04 (×5): qty 1

## 2016-04-04 MED ORDER — METRONIDAZOLE IN NACL 5-0.79 MG/ML-% IV SOLN
500.0000 mg | Freq: Three times a day (TID) | INTRAVENOUS | Status: DC
  Administered 2016-04-04 – 2016-04-06 (×6): 500 mg via INTRAVENOUS
  Filled 2016-04-04 (×8): qty 100

## 2016-04-04 MED ORDER — MAGNESIUM SULFATE 4 GM/100ML IV SOLN
4.0000 g | Freq: Once | INTRAVENOUS | Status: AC
  Administered 2016-04-05: 4 g via INTRAVENOUS
  Filled 2016-04-04: qty 100

## 2016-04-04 MED ORDER — ENSURE ENLIVE PO LIQD
237.0000 mL | Freq: Two times a day (BID) | ORAL | Status: DC
  Administered 2016-04-05 – 2016-04-07 (×4): 237 mL via ORAL

## 2016-04-04 MED ORDER — RISAQUAD PO CAPS
2.0000 | ORAL_CAPSULE | Freq: Every day | ORAL | Status: DC
  Administered 2016-04-04 – 2016-04-09 (×6): 2 via ORAL
  Filled 2016-04-04 (×6): qty 2

## 2016-04-04 MED ORDER — SODIUM CHLORIDE 0.9 % IV BOLUS (SEPSIS)
1000.0000 mL | Freq: Once | INTRAVENOUS | Status: AC
  Administered 2016-04-04: 1000 mL via INTRAVENOUS

## 2016-04-04 MED ORDER — CETYLPYRIDINIUM CHLORIDE 0.05 % MT LIQD
7.0000 mL | Freq: Two times a day (BID) | OROMUCOSAL | Status: DC
  Administered 2016-04-04 – 2016-04-09 (×11): 7 mL via OROMUCOSAL

## 2016-04-04 MED ORDER — VANCOMYCIN 50 MG/ML ORAL SOLUTION
250.0000 mg | Freq: Four times a day (QID) | ORAL | Status: DC
  Administered 2016-04-04 – 2016-04-09 (×20): 250 mg via ORAL
  Filled 2016-04-04 (×23): qty 5

## 2016-04-04 MED ORDER — NOREPINEPHRINE BITARTRATE 1 MG/ML IV SOLN: 2.0000 ug/min | INTRAVENOUS | Status: DC

## 2016-04-04 NOTE — Progress Notes (Signed)
Notified Dr. Leslye Peer about patient's blood pressure. MD ordered 1L bolus of normal saline. Will continue to monitor.

## 2016-04-04 NOTE — Plan of Care (Signed)
Problem: Fluid Volume: Goal: Ability to maintain a balanced intake and output will improve Outcome: Not Progressing Pt has been slightly hypotensive today, 500cc fluid bolus given   Problem: Bowel/Gastric: Goal: Will not experience complications related to bowel motility Outcome: Not Progressing Large amounts of loose stool continues today

## 2016-04-04 NOTE — Progress Notes (Signed)
Bolus currently still infusing. RN saw that Dr. Leslye Peer had placed order for levophed infusion. RN spoke with MD about patient current IV access (no central line currently). MD clarified that RN to wait until bolus finished before starting levophed if patient not meeting MAP goal. MD stated he would place a consult for the Intensivists to evaluate for central line placement.

## 2016-04-04 NOTE — Evaluation (Signed)
Clinical/Bedside Swallow Evaluation Patient Details  Name: Samuel Ortiz MRN: [id number] Date of Birth: Oct 16, 1934  Today's Date: 04/04/2016 Time: SLP Start Time (ACUTE ONLY): 1100 SLP Stop Time (ACUTE ONLY): 1200 SLP Time Calculation (min) (ACUTE ONLY): 60 min  Past Medical History:  Past Medical History  Diagnosis Date  . Cancer ([place])   . Hypothyroidism   . GERD (gastroesophageal reflux disease)   . Hypertension   . Arthritis   . Depression   . Shingles   . Vitamin B 12 deficiency   . Hypercholesterolemia   . Postherpetic neuralgia    Past Surgical History:  Past Surgical History  Procedure Laterality Date  . Tonsillectomy    . Coronary artery bypass graft    . Throat cancer      resection of cancer  . Fracture surgery    . Orif femur fraction Right    HPI:  Pt is a 82 year old male who was recently at [facility] with pneumonia thought to be aspiration pneumonia. He was treated with three IV antibiotics inpatient and at discharge had some diarrhea. The patient was admitted at [facility] for 5 days. At home his diarrhea continued, there was a brief period of improvement but he has rebounded and he has 3-4 movements a day. He is weak and having lots of accidents. He is also had little liquid intake by mouth. He denies any abdominal pain. Today he started with nausea and vomiting, his emesis was black. He had 3 episodes of black emesis today. He denies GERD or epigastric pain. He states he takes aspirin daily, on reviewing his meds he also has meloxicam ordered. He denies spicy foods. Per family prior to his hospital admission [place] he also had some black emesis but this resolved during his hospital stay. The patient has a history of throat cancer and is status post radiation. He has dysphagia and hypothyroidism from his radiation. He is pured diet but thin liquids. He is becoming more deconditioned and not moving around much. Per family he is very depressed but patient  denies. His wife is his primary caretaker, she seems overwhelmed as does his daughter by the patient's progressive debility and now almost total care needs. Pt is currently awake, verbally conversive. Unsure if of his full level of comprehension of his deficits especially his swallowing deficits in regards to its relationship w/ potential aspiration from the Esophageal phase deficits and need for Esophageal dilitation. Dtr. stated pt has been told he needs the dilitation but "refuses it now". Pt has had 2-3 Esophageal dilitations post the Throat Ca and Radiation txs. Of note, pt had a PEG placement during that time d/t the degree of dysphagia of the Throat and Esophagus. Pt has been eating more of a pureed typoe diet but stated he continues to eat "ham" and "I put my dentures in for that".     Assessment / Plan / Recommendation Clinical Impression  Pt appears to exhibit adequate oropharyngeal phase swallowing w/ reduced risk for aspiration following general aspiration precautions including consuming those foods that are broken down and moistened well for easier motility d/t his baseline Esophageal phase dysphagia. The Esophageal phase deficits appear to stem from previous Ca and Radiation txs; pt has received Esophageal dilititations in the past and has been recommended to have another but is refusing at this time per family report. Any Esophageal phase dysmotility or increaesed risk for regurgitation could increase risk for an aspiration event of the reflux material thus impacting pulmonary status. With  po trials, pt exhibited no overt s/s of aspiration w/ trials of thin liquids and softened foods; no oral phaes deficits w/ the foods moistened and softened; he is edentulous. Pharyngeal swallowing was w/out coughing or change in vocal quality; bolus management and A-P transfer were timely. Due to pt's h/o Esophageal phase deficits and dysphagia as well as edentulous status and overall weakness from declined  pulmonary status, recommend a Dysphagia level 2 diet w/ added pureed foods; use of condiments/gravy; strict Reflux precautions and aspiration precautions. Recommend meds in puree if needed for easier swallowing. (Pt tolerated meds w/ water w/ NSG today.) Recommend f/u w/ GI for further education; Dietician consult. Recommend a Palliative Care consult for overall education and POC; Dtr agreed.     Aspiration Risk  Mild aspiration risk (d/t Esophageal phase deficits baseline)    Diet Recommendation  Dysphagia 2 w/ thin liquids; strict REFLUX precautions; general aspiration precautions; tray setup   Medication Administration: Whole meds with liquid (or w/ puree) if easier for Esophageal clearing/swallowing   Other  Recommendations Recommended Consults: Consider GI evaluation (dietician consult; palliative care consult; PT consult) Oral Care Recommendations: Oral care BID;Staff/trained caregiver to provide oral care   Follow up Recommendations  None    Frequency and Duration            Prognosis Prognosis for Safe Diet Advancement: Fair Barriers to Reach Goals: Severity of deficits;Behavior Barriers/Prognosis Comment: unsure of pt's Cognitive status at baseline re: his deficits      Swallow Study   General Date of Onset: 04/03/16 HPI: Pt is a 82 year old male who was recently at [facility] with pneumonia thought to be aspiration pneumonia. He was treated with three IV antibiotics inpatient and at discharge had some diarrhea. The patient was admitted at [facility] for 5 days. At home his diarrhea continued, there was a brief period of improvement but he has rebounded and he has 3-4 movements a day. He is weak and having lots of accidents. He is also had little liquid intake by mouth. He denies any abdominal pain. Today he started with nausea and vomiting, his emesis was black. He had 3 episodes of black emesis today. He denies GERD or epigastric pain. He states he takes aspirin daily, on  reviewing his meds he also has meloxicam ordered. He denies spicy foods. Per family prior to his hospital admission [place] he also had some black emesis but this resolved during his hospital stay. The patient has a history of throat cancer and is status post radiation. He has dysphagia and hypothyroidism from his radiation. He is pured diet but thin liquids. He is becoming more deconditioned and not moving around much. Per family he is very depressed but patient denies. His wife is his primary caretaker, she seems overwhelmed as does his daughter by the patient's progressive debility and now almost total care needs. Pt is currently awake, verbally conversive. Unsure if of his full level of comprehension of his deficits especially his swallowing deficits in regards to its relationship w/ potential aspiration from the Esophageal phase deficits and need for Esophageal dilitation. Dtr. stated pt has been told he needs the dilitation but "refuses it now". Pt has had 2-3 Esophageal dilitations post the Throat Ca and Radiation txs. Of note, pt had a PEG placement during that time d/t the degree of dysphagia of the Throat and Esophagus. Pt has been eating more of a pureed typoe diet but stated he continues to eat "ham" and "I put my dentures  in for that".   Type of Study: Bedside Swallow Evaluation Previous Swallow Assessment: none but does have GI f/u for Esophageal dilitation Diet Prior to this Study: Thin liquids (soft, "mush" foods at home per report; "candy") Temperature Spikes Noted: Yes (at admission;  wbc 26.4) Respiratory Status: Nasal cannula (4 liters) History of Recent Intubation: No Behavior/Cognition: Alert;Cooperative;Pleasant mood;Requires cueing;Distractible Oral Care Completed by SLP: Recent completion by staff Oral Cavity - Dentition:  (dentures - not wearing currently) Vision: Functional for self-feeding Self-Feeding Abilities: Able to feed self;Needs assist;Needs set up Patient  Positioning: Upright in bed Baseline Vocal Quality: Normal Volitional Cough: Congested Volitional Swallow: Able to elicit    Oral/Motor/Sensory Function Overall Oral Motor/Sensory Function: Within functional limits   Ice Chips Ice chips: Not tested   Thin Liquid Thin Liquid: Within functional limits Presentation: Straw;Self Fed (few trials w/ NSG; 5-6 trials w/ SLP) Other Comments: does not drink cold drinks     Nectar Thick Nectar Thick Liquid: Not tested   Honey Thick Honey Thick Liquid: Not tested   Puree Puree: Within functional limits Presentation: Spoon (fed; 4 trials)   Solid   GO   Solid: Within functional limits (broken down, softened w/ puree) Presentation: Spoon (4 trials)       Orinda Kenner, MS, CCC-SLP  Watson,Katherine 04/04/2016,2:30 PM

## 2016-04-04 NOTE — Progress Notes (Signed)
Discussed pt's current blood pressure with Magdelene, NP.  MAP is currently 68 but bp is now at 80/59.  Pt is sleeping.  Per Magdelene, MAP is ok.  As long as there is no change in mentation, then no new orders at this time.

## 2016-04-04 NOTE — Progress Notes (Signed)
Patient ID: Samuel Ortiz, male   DOB: [birth year], 82 y.o.   MRN: [id number] [facility] PROGRESS NOTE  Samuel Ortiz [id number] DOB: 26-Jan-1934 DOA: 04/03/2016 PCP: Sherrin Daisy, MD  HPI/Subjective: Patient feels okay. He offers no complaints. As per daughter and wife, he vomited up some dark material. Also having diarrhea. Unable to get to the bathroom in time. Having falls at home. Recent antibiotics for pneumonia.  Objective: Filed Vitals:   04/04/16 1100 04/04/16 1130  BP: 90/42 103/62  Pulse: 60 65  Temp:    Resp: 16 20    Filed Weights   04/03/16 1849 04/03/16 2225  Weight: 104.327 kg (230 lb) 103.2 kg (227 lb 8.2 oz)    ROS: Review of Systems  Constitutional: Negative for fever and chills.  Eyes: Negative for blurred vision.  Respiratory: Negative for cough and shortness of breath.   Cardiovascular: Negative for chest pain.  Gastrointestinal: Positive for diarrhea. Negative for nausea, vomiting, abdominal pain and constipation.  Genitourinary: Negative for dysuria.  Musculoskeletal: Negative for joint pain.  Neurological: Negative for dizziness and headaches.   Exam: Physical Exam  Constitutional: He is oriented to person, place, and time.  HENT:  Nose: No mucosal edema.  Mouth/Throat: No oropharyngeal exudate or posterior oropharyngeal edema.  Eyes: Conjunctivae, EOM and lids are normal. Pupils are equal, round, and reactive to light.  Neck: No JVD present. Carotid bruit is not present. No edema present. No thyroid mass and no thyromegaly present.  Cardiovascular: S1 normal and S2 normal.  Exam reveals no gallop.   No murmur heard. Pulses:      Dorsalis pedis pulses are 2+ on the right side, and 2+ on the left side.  Respiratory: No respiratory distress. He has no wheezes. He has no rhonchi. He has no rales.  GI: Soft. Bowel sounds are normal. There is no tenderness.  Musculoskeletal:       Right ankle: He exhibits swelling.       Left ankle:  He exhibits swelling.  Lymphadenopathy:    He has no cervical adenopathy.  Neurological: He is alert and oriented to person, place, and time. No cranial nerve deficit.  Skin: Skin is warm. No rash noted. Nails show no clubbing.  Psychiatric: He has a normal mood and affect.      Data Reviewed: Basic Metabolic Panel:  Recent Labs Lab 04/03/16 1902 04/04/16 0358  NA 136 136  K 5.0 5.0  CL 103 104  CO2 24 25  GLUCOSE 107* 121*  BUN 18 21*  CREATININE 1.19 1.34*  CALCIUM 7.8* 7.4*   Liver Function Tests:  Recent Labs Lab 04/03/16 1902  AST 18  ALT 12*  ALKPHOS 16*  BILITOT 0.6  PROT 7.3  ALBUMIN 3.2*   CBC:  Recent Labs Lab 04/03/16 1902 04/03/16 2231 04/04/16 0358 04/04/16 0630 04/04/16 1513  WBC 22.6*  --  26.4*  --   --   NEUTROABS 21.1*  --   --   --   --   HGB 11.8* 12.2* 10.4* 10.8* 10.7*  HCT 36.1* 37.8* 32.0* 32.2* 32.9*  MCV 85.9  --  86.1  --   --   PLT 418  --  372  --   --    Cardiac Enzymes:  Recent Labs Lab 04/03/16 1902 04/03/16 2231 04/04/16 0358 04/04/16 1030  TROPONINI 0.05* 0.07* 0.06* 0.06*   BNP (last 3 results)  Recent Labs  04/03/16 1902  BNP 374.0*     CBG:  Recent Labs Lab 04/03/16 2214 04/04/16 0721  GLUCAP 94 109*    Recent Results (from the past 240 hour(s))  C difficile quick scan w PCR reflex     Status: Abnormal   Collection Time: 04/03/16  8:25 PM  Result Value Ref Range Status   C Diff antigen POSITIVE (A) NEGATIVE Final   C Diff toxin POSITIVE (A) NEGATIVE Final   C Diff interpretation   Final    Positive for toxigenic C. difficile, active toxin production present.    Comment: CRITICAL RESULT CALLED TO, READ BACK BY AND VERIFIED WITH: BUTCH WOODS AT 2128 04/03/16 MLZ    Gastrointestinal Panel by PCR , Stool     Status: None   Collection Time: 04/03/16  8:25 PM  Result Value Ref Range Status   Campylobacter species NOT DETECTED NOT DETECTED Final   Plesimonas shigelloides NOT DETECTED NOT  DETECTED Final   Salmonella species NOT DETECTED NOT DETECTED Final   Yersinia enterocolitica NOT DETECTED NOT DETECTED Final   Vibrio species NOT DETECTED NOT DETECTED Final   Vibrio cholerae NOT DETECTED NOT DETECTED Final   Enteroaggregative E coli (EAEC) NOT DETECTED NOT DETECTED Final   Enteropathogenic E coli (EPEC) NOT DETECTED NOT DETECTED Final   Enterotoxigenic E coli (ETEC) NOT DETECTED NOT DETECTED Final   Shiga like toxin producing E coli (STEC) NOT DETECTED NOT DETECTED Final   E. coli O157 NOT DETECTED NOT DETECTED Final   Shigella/Enteroinvasive E coli (EIEC) NOT DETECTED NOT DETECTED Final   Cryptosporidium NOT DETECTED NOT DETECTED Final   Cyclospora cayetanensis NOT DETECTED NOT DETECTED Final   Entamoeba histolytica NOT DETECTED NOT DETECTED Final   Giardia lamblia NOT DETECTED NOT DETECTED Final   Adenovirus F40/41 NOT DETECTED NOT DETECTED Final   Astrovirus NOT DETECTED NOT DETECTED Final   Norovirus GI/GII NOT DETECTED NOT DETECTED Final   Rotavirus A NOT DETECTED NOT DETECTED Final   Sapovirus (I, II, IV, and V) NOT DETECTED NOT DETECTED Final  Culture, blood (routine x 2)     Status: None (Preliminary result)   Collection Time: 04/03/16  8:34 PM  Result Value Ref Range Status   Specimen Description BLOOD LEFT ASSIST CONTROL  Final   Special Requests   Final    BOTTLES DRAWN AEROBIC AND ANAEROBIC 10CCAERO,20CCANA   Culture NO GROWTH < 24 HOURS  Final   Report Status PENDING  Incomplete  Culture, blood (routine x 2)     Status: None (Preliminary result)   Collection Time: 04/03/16  8:34 PM  Result Value Ref Range Status   Specimen Description BLOOD RIGHT ASSIST CONTROL  Final   Special Requests   Final    BOTTLES DRAWN AEROBIC AND ANAEROBIC 20CCANA,17CCANA   Culture NO GROWTH < 24 HOURS  Final   Report Status PENDING  Incomplete  MRSA PCR Screening     Status: None   Collection Time: 04/03/16 10:21 PM  Result Value Ref Range Status   MRSA by PCR NEGATIVE  NEGATIVE Final    Comment:        The GeneXpert MRSA Assay (FDA approved for NASAL specimens only), is one component of a comprehensive MRSA colonization surveillance program. It is not intended to diagnose MRSA infection nor to guide or monitor treatment for MRSA infections.      Studies: Dg Chest 2 View  04/03/2016  CLINICAL DATA:  Recent fall with hypoxia EXAM: CHEST  2 VIEW COMPARISON:  06/30/2012 FINDINGS: Cardiac shadow is mildly enlarged but stable.  Postsurgical changes are again seen. The lungs are well aerated bilaterally. There is a somewhat rounded density identified in the right lung base which likely represents a confluence of infiltrative changes in the right lower and right middle lobe given the changes on the lateral projection. Some prior rib fractures with healing are noted. Postsurgical changes are noted in the left hilar region as well. IMPRESSION: Patchy changes in the right base which appear to be related to right middle and right lower lobe infiltrate when evaluated on the lateral projection. Electronically Signed   By: Inez Catalina M.D.   On: 04/03/2016 19:53    Scheduled Meds: . acidophilus  2 capsule Oral Daily  . antiseptic oral rinse  7 mL Mouth Rinse BID  . famotidine (PEPCID) IV  20 mg Intravenous Q12H  . feeding supplement (ENSURE ENLIVE)  237 mL Oral BID BM  . gabapentin  900 mg Oral TID  . levothyroxine  200 mcg Oral QAC breakfast  . pantoprazole  40 mg Oral Daily  . sertraline  100 mg Oral Daily  . sodium chloride  1,000 mL Intravenous Once  . sodium chloride flush  3 mL Intravenous Q12H  . vancomycin  250 mg Oral Q6H   Continuous Infusions: . sodium chloride 125 mL/hr at 04/04/16 1000  . norepinephrine (LEVOPHED) Adult infusion      Assessment/Plan:  1. Clinical sepsis with C. difficile colitis, leukocytosis, fever. Continue oral vancomycin. Patient also has hypotension receiving second fluid bolus. I may need to start pressors depending on  blood pressure trend. Holding antihypertensive medication. Acidophilus started. 2. Vomiting dark material. Hemoglobin remaining stable so far. Continue to monitor closely. Start Protonix orally and continue Pepcid IV. Unfortunately no gastroenterologist coverage over the weekend. Not sure if this is a GI bleed. 3. Dysphagia diet 4. Hypothyroidism unspecified on levothyroxine 5. Depression on Zoloft 6. Syncope secondary to hypotension 7. Elevated troponin likely demand ischemia from hypotension 8. Family interested in rehabilitation  Code Status:     Code Status Orders        Start     Ordered   04/03/16 2223  Do not attempt resuscitation (DNR)   Continuous    Question Answer Comment  In the event of cardiac or respiratory ARREST Do not call a "code blue"   In the event of cardiac or respiratory ARREST Do not perform Intubation, CPR, defibrillation or ACLS   In the event of cardiac or respiratory ARREST Use medication by any route, position, wound care, and other measures to relive pain and suffering. May use oxygen, suction and manual treatment of airway obstruction as needed for comfort.      04/03/16 2222    Code Status History    Date Active Date Inactive Code Status Order ID Comments User Context   This patient has a current code status but no historical code status.    Advance Directive Documentation        Most Recent Value   Type of Advance Directive  Living will   Pre-existing out of facility DNR order (yellow form or pink MOST form)     "MOST" Form in Place?       Family Communication: Spoke with wife at the bedside and daughter at the nursing station Disposition Plan: To be determined  Antibiotics:  Oral vancomycin  Time spent: 35 minutes in coordination of care on this critically ill patient  [place], [place]

## 2016-04-04 NOTE — Progress Notes (Signed)
[facility] Progress Note Patient Name: YAO BROSSMAN DOB: [birth year] MRN: [id number]   Date of Service  04/04/2016  HPI/Events of Note  Hypomag  eICU Interventions  Mag replaced     Intervention Category Intermediate Interventions: Electrolyte abnormality - evaluation and management  Luis Nickles 04/04/2016, 11:50 PM

## 2016-04-04 NOTE — Progress Notes (Signed)
Initial Nutrition Assessment  DOCUMENTATION CODES:   Not applicable  INTERVENTION:  -Cater to pt preferences; pt on Dysphagia II but SLP allowing pancakes; pt requesting eggs and pancakes for lunch, RD called down for tray -Add Magic Cup at [employer]; Ensure BID between meals   NUTRITION DIAGNOSIS:   Inadequate oral intake related to poor appetite, dysphagia as evidenced by per patient/family report.  GOAL:   Patient will meet greater than or equal to 90% of their needs  MONITOR:   PO intake, Supplement acceptance, Weight trends  REASON FOR ASSESSMENT:   Consult Poor PO  ASSESSMENT:    82 yo male admitted with hypotension, aspiration pneumonia, dysphagia. Pt with hx of throat cancer s/p radiation. Pt with diarrhea, C.diff positive  Pt in process of having rectal tube inserted on visit today; pt reporting he has not eaten anything in 3 days, reports prior to this appetite has been good. Pt reports wt has been stable. Family not at bedside on visit today but per MD notes, family reporting pt is depressed, reporting that pt is becoming more and more deconditioned and debilitated (almost total care now). Family also reporting that pt with poor appetite, eats a lot of junk/candy; pt with poor dentition but does not wear dentures. SLP has evaluated pt this AM and diet has been adjusted. Pt does not report swallowing difficulty on visit today but noted hx of dysphagia  Unable to complete Nutrition-Focused physical exam at this time.   Past Medical History  Diagnosis Date  . Cancer ([place])   . Hypothyroidism   . GERD (gastroesophageal reflux disease)   . Hypertension   . Arthritis   . Depression   . Shingles   . Vitamin B 12 deficiency   . Hypercholesterolemia   . Postherpetic neuralgia      Diet Order:  DIET DYS 2 Room service appropriate?: Yes with Assist; Fluid consistency:: Thin  Skin:  Reviewed, no issues  Last BM:  04/04/16 diarrhea, C.diff positive  Labs:  reviewed  Meds: NS at 125 ml/hr, acidophilus  Height:   Ht Readings from Last 1 Encounters:  04/03/16 6' (1.829 m)    Weight:   Wt Readings from Last 1 Encounters:  04/03/16 227 lb 8.2 oz (103.2 kg)    BMI:  Body mass index is 30.85 kg/(m^2).  Estimated Nutritional Needs:   Kcal:  2200-2500 kcals  Protein:  100-120 g  Fluid:  </= 2 L  EDUCATION NEEDS:   No education needs identified at this time  [place], [place], [place] [phone] Pager  [phone] Weekend/On-Call Pager

## 2016-04-04 NOTE — Consult Note (Signed)
PULMONARY / CRITICAL CARE MEDICINE   Name: CHRISHAUN STOEN MRN: [id number] DOB: 12-26-1933    ADMISSION DATE:  04/03/2016   CONSULTATION DATE: 04/04/2016  REFERRING MD:  Hospitalist  CHIEF COMPLAINT: Fall at home   HISTORY OF PRESENT ILLNESS:   This is a 82 year old Caucasian male with a past medical history of hypothyroidism, throat cancer, GERD, hypertension, depression, and hyperlipidemia who presented to the ER following a fall at home. He reported that he got out of bed quickly, lost his balance and fell. He did not loss consciousness. He also reported some diarrhea that started prior to his last hospital discharge. Of note, patient was recently admitted at [facility] and treated for pneumonia, which at that time was thought to be aspiration pneumonia. His ED workup revealed a mildly elevated troponin and an T-wave inversions on EKG. He was admitted for evaluation and treatment of near syncope.  His stool culture was positive for C. Diff. Upon admission, patient continued to have profuse diarrhea and became hypotensive with systolic blood pressures in the low 70s and 80s. PCCM was consulted for possible central line placement and pressors for refractory hypotension.   PAST MEDICAL HISTORY :  He  has a past medical history of Cancer ([place]); Hypothyroidism; GERD (gastroesophageal reflux disease); Hypertension; Arthritis; Depression; Shingles; Vitamin B 12 deficiency; Hypercholesterolemia; and Postherpetic neuralgia.  PAST SURGICAL HISTORY: He  has past surgical history that includes Tonsillectomy; Coronary artery bypass graft; throat cancer; Fracture surgery; and orif femur fraction (Right).  Allergies  Allergen Reactions  . Morphine And Related Other (See Comments)    Reaction:  Hallucinations     No current facility-administered medications on file prior to encounter.   Current Outpatient Prescriptions on File Prior to Encounter  Medication Sig  . carvedilol (COREG)  3.125 MG tablet Take 3.125 mg by mouth 2 (two) times daily with a meal.  . ferrous gluconate (FERGON) 324 MG tablet Take 324 mg by mouth daily with breakfast.  . gabapentin (NEURONTIN) 300 MG capsule Take 900 mg by mouth 3 (three) times daily.   Marland Kitchen HYDROcodone-acetaminophen (NORCO/VICODIN) 5-325 MG per tablet Take 1 tablet by mouth every 6 (six) hours as needed for moderate pain.  Marland Kitchen levothyroxine (SYNTHROID, LEVOTHROID) 200 MCG tablet Take 200 mcg by mouth daily before breakfast.  . lisinopril (PRINIVIL,ZESTRIL) 20 MG tablet Take 20 mg by mouth daily.  . meloxicam (MOBIC) 7.5 MG tablet Take 7.5 mg by mouth daily.  Marland Kitchen omega-3 acid ethyl esters (LOVAZA) 1 G capsule Take 1 g by mouth daily.  . sertraline (ZOLOFT) 100 MG tablet Take 100 mg by mouth daily.  . simvastatin (ZOCOR) 20 MG tablet Take 20 mg by mouth at bedtime.   . vitamin B-12 (CYANOCOBALAMIN) 1000 MCG tablet Take 1,000 mcg by mouth daily.    FAMILY HISTORY:  His has no family status information on file.   SOCIAL HISTORY: He  reports that he quit smoking about 55 years ago. He does not have any smokeless tobacco history on file. He reports that he does not drink alcohol or use illicit drugs.  REVIEW OF SYSTEMS:   Constitutional: Negative for fever and chills.  HENT: Negative for congestion and rhinorrhea.  Eyes: Negative for redness and visual disturbance.  Respiratory: Negative for shortness of breath and wheezing.  Cardiovascular: Negative for chest pain and palpitations.  Gastrointestinal: Positive  for nausea , vomiting with dark coffee-ground emesis and abdominal pain and  loose stools Genitourinary: Negative for dysuria and urgency.  Musculoskeletal:  Negative for myalgias and arthralgias.  Skin: Negative for pallor and wound.  Neurological: Positive for syncope, but negative for  headaches   SUBJECTIVE:   VITAL SIGNS: BP 82/60 mmHg  Pulse 70  Temp(Src) 98.9 F (37.2 C) (Oral)  Resp 20  Ht 6' (1.829 m)  Wt 227 lb  8.2 oz (103.2 kg)  BMI 30.85 kg/m2  SpO2 93%  HEMODYNAMICS:    VENTILATOR SETTINGS:    INTAKE / OUTPUT: I/O last 3 completed shifts: In: 4110.4 [I.V.:2510.4; IV Piggyback:1600] Out: 600 [Urine:400; Stool:200]  PHYSICAL EXAMINATION: General:  Chronically ill-looking Neuro:  Alert and oriented 3, follows commands, moves all extremities HEENT:  PERRLA, trachea midline, oral mucosa dry Cardiovascular: Rate and rhythm regular, S1, S2, no murmur, regurg or gallop Lungs: Clear to auscultation bilaterally with no wheezes or rhonchi Abdomen:  Soft, nontender, nondistended with normal bowel sounds Musculoskeletal:  Positive range of motion in upper and lower extremities. Diminished grip strength in upper extremities Extremities: No edema, +2 pulses Skin:  Warm and dry  LABS:  BMET  Recent Labs Lab 04/03/16 1902 04/04/16 0358  NA 136 136  K 5.0 5.0  CL 103 104  CO2 24 25  BUN 18 21*  CREATININE 1.19 1.34*  GLUCOSE 107* 121*    Electrolytes  Recent Labs Lab 04/03/16 1902 04/04/16 0358  CALCIUM 7.8* 7.4*    CBC  Recent Labs Lab 04/03/16 1902  04/04/16 0358 04/04/16 0630 04/04/16 1513  WBC 22.6*  --  26.4*  --   --   HGB 11.8*  < > 10.4* 10.8* 10.7*  HCT 36.1*  < > 32.0* 32.2* 32.9*  PLT 418  --  372  --   --   < > = values in this interval not displayed.  Coag's No results for input(s): APTT, INR in the last 168 hours.  Sepsis Markers  Recent Labs Lab 04/03/16 2034 04/03/16 2231  LATICACIDVEN 1.0 1.3    ABG No results for input(s): PHART, PCO2ART, PO2ART in the last 168 hours.  Liver Enzymes  Recent Labs Lab 04/03/16 1902  AST 18  ALT 12*  ALKPHOS 16*  BILITOT 0.6  ALBUMIN 3.2*    Cardiac Enzymes  Recent Labs Lab 04/03/16 2231 04/04/16 0358 04/04/16 1030  TROPONINI 0.07* 0.06* 0.06*    Glucose  Recent Labs Lab 04/03/16 2214 04/04/16 0721 04/04/16 1557  GLUCAP 94 109* 119*    Imaging No results found.   STUDIES:   None  CULTURES: Urine culture MRSA screen negative Blood cultures 2. C. difficile positive GI panel negative  ANTIBIOTICS: Vancomycin by mouth IV Flagyl  SIGNIFICANT EVENTS: 04/03/2016: Admitted for evaluation and treatment of syncope, worsening diarrhea, C. difficile positive, hypotensive  LINES/TUBES: Peripheral IVs  DISCUSSION: 82 year old Caucasian male presenting with shock hypovolumic shock from diarrhea from  C diff  ASSESSMENT / PLAN:  PULMONARY A: Recent pneumonia P:   -CXR prn -Supplemental O2 prn  CARDIOVASCULAR A:  Mildly elevated troponin Hypotension secondary to volume depletion Syncope-likely secondary to volume depletion P:  -Blood pressure improved with additional fluid boluses.  -No need for CVL at this time -IV fluid bolus as needed  RENAL A:   AKI-Creatinine trending down with hydration Hypomagnesemia P:   -Continue IV hydration -Monitor and replace electrolytes -Monitor Is/Os  GASTROINTESTINAL A:   GERD-Reported dark colored emesis prior to admission, no signs of GIB P:     HEMATOLOGIC A:   Mild anemia-hemoglobin is stable P:  -Continue to monitor and transfuse per  ICU protocol  INFECTIOUS A:   C. difficile colitis. Sepsis secondary to C. difficile P:   -flagyl and vancomycin po -Follow-up cultures  ENDOCRINE A:   Hypothyroidism-TSH normal  P:   -Continue current Synthroid dose  NEUROLOGIC A:   Frequent falls. Generalized debility. History of depression P:   -Strict fall precautions -PT evaluation for strength training. -Patient may require placement and self-care assistance  Disposition and family update: No family at bedside. Will update once available.  Best Practice: Code Status: DNR Diet: Regular GI prophylaxis: Chronic Pepcid use VTE prophylaxis:  SCD's / heparin  Total critical care time=45 MINUTES  [facility]dalene [facility] ANP-BC Pulmonary and [place] Pager  [phone] or [phone] 04/04/2016, 8:41 PM   STAFF NOTE: I, Dr. Corrin Parker,  have personally reviewed patient's available data, including medical history, events of note, physical examination and test results as part of my evaluation. I have discussed with NP and other care providers such as pharmacist, RN and RRT.  In addition,  I personally evaluated patient and elicited key findings     A:acute hypovolumic shock from c diff colitis and diarrhea  P:  IVF's Ok to transfer to gen med floor    The Rest per NP whose note is outlined above and that I agree with  I have personally reviewed/obtained a history, examined the patient, evaluated Pertinent laboratory and [employer]/imaging results, and  formulated the assessment and plan   The Patient requires high complexity decision making for assessment and support, frequent evaluation and titration of therapies, application of advanced monitoring technologies and extensive interpretation of multiple databases.    Corrin Parker, M.D.  [facility]  Medical Director [place] Director [facility] Cardio-Pulmonary Department

## 2016-04-05 LAB — CBC
HEMATOCRIT: 32.7 % — AB (ref 40.0–52.0)
HEMOGLOBIN: 10.6 g/dL — AB (ref 13.0–18.0)
MCH: 28 pg (ref 26.0–34.0)
MCHC: 32.5 g/dL (ref 32.0–36.0)
MCV: 86.1 fL (ref 80.0–100.0)
Platelets: 339 10*3/uL (ref 150–440)
RBC: 3.79 MIL/uL — AB (ref 4.40–5.90)
RDW: 14.1 % (ref 11.5–14.5)
WBC: 24.7 10*3/uL — ABNORMAL HIGH (ref 3.8–10.6)

## 2016-04-05 LAB — BASIC METABOLIC PANEL
Anion gap: 5 (ref 5–15)
BUN: 19 mg/dL (ref 6–20)
CHLORIDE: 111 mmol/L (ref 101–111)
CO2: 20 mmol/L — AB (ref 22–32)
CREATININE: 1.18 mg/dL (ref 0.61–1.24)
Calcium: 7.3 mg/dL — ABNORMAL LOW (ref 8.9–10.3)
GFR calc non Af Amer: 57 mL/min — ABNORMAL LOW (ref >60)
Glucose, Bld: 97 mg/dL (ref 65–99)
POTASSIUM: 4.4 mmol/L (ref 3.5–5.1)
Sodium: 136 mmol/L (ref 135–145)

## 2016-04-05 LAB — MAGNESIUM: MAGNESIUM: 2.4 mg/dL (ref 1.7–2.4)

## 2016-04-05 LAB — PHOSPHORUS: Phosphorus: 2.7 mg/dL (ref 2.5–4.6)

## 2016-04-05 LAB — URINE CULTURE: SPECIAL REQUESTS: NORMAL

## 2016-04-05 NOTE — Progress Notes (Signed)
PULMONARY / CRITICAL CARE MEDICINE   Name: Samuel Ortiz MRN: [id number] DOB: [birth year]    ADMISSION DATE:  04/03/2016   CONSULTATION DATE: 04/04/2016  REFERRING MD:  Hospitalist  CHIEF COMPLAINT: Fall at home   HISTORY OF PRESENT ILLNESS:   This is a 82 year old Caucasian male with a past medical history of hypothyroidism, throat cancer, GERD, hypertension, depression, and hyperlipidemia who presented to the ER following a fall at home. He reported that he got out of bed quickly, lost his balance and fell. He did not loss consciousness. He also reported some diarrhea that started prior to his last hospital discharge. Of note, patient was recently admitted at [facility] and treated for pneumonia, which at that time was thought to be aspiration pneumonia. His ED workup revealed a mildly elevated troponin and an T-wave inversions on EKG. He was admitted for evaluation and treatment of near syncope.  His stool culture was positive for C. Diff. Upon admission, patient continued to have profuse diarrhea and became hypotensive with systolic blood pressures in the low 70s and 80s. PCCM was consulted for possible central line placement and pressors for refractory hypotension.   SUBJECTIVE: No acute issues overnight. Rectal tube with ~800cc. BP borderline but responsive to fluid boluses.   VITAL SIGNS: BP 146/46 mmHg  Pulse 63  Temp(Src) 98 F (36.7 C) (Oral)  Resp 19  Ht 6' (1.829 m)  Wt 227 lb 8.2 oz (103.2 kg)  BMI 30.85 kg/m2  SpO2 100%  HEMODYNAMICS:    VENTILATOR SETTINGS:    INTAKE / OUTPUT: I/O last 3 completed shifts: In: 5935.4 [I.V.:3985.4; IV Piggyback:1950] Out: 1900 [Urine:900; Stool:1000]  PHYSICAL EXAMINATION: General:  Chronically ill-looking Neuro:  Alert and oriented 3, follows commands, moves all extremities HEENT:  PERRLA, trachea midline, oral mucosa dry Cardiovascular: Rate and rhythm regular, S1, S2, no murmur, regurg or gallop Lungs: Clear  to auscultation bilaterally with no wheezes or rhonchi Abdomen:  Soft, nontender, nondistended with normal bowel sounds Musculoskeletal:  Positive range of motion in upper and lower extremities. Diminished grip strength in upper extremities Extremities: No edema, +2 pulses Skin:  Warm and dry  LABS:  BMET  Recent Labs Lab 04/04/16 0358 04/04/16 2214 04/05/16 0420  NA 136 135 136  K 5.0 4.4 4.4  CL 104 109 111  CO2 25 20* 20*  BUN 21* 21* 19  CREATININE 1.34* 1.22 1.18  GLUCOSE 121* 116* 97    Electrolytes  Recent Labs Lab 04/04/16 0358 04/04/16 2214 04/05/16 0420  CALCIUM 7.4* 7.0* 7.3*  MG  --  1.5* 2.4  PHOS  --  2.7 2.7    CBC  Recent Labs Lab 04/03/16 1902  04/04/16 0358  04/04/16 1513 04/04/16 2214 04/05/16 0420  WBC 22.6*  --  26.4*  --   --   --  24.7*  HGB 11.8*  < > 10.4*  < > 10.7* 10.2* 10.6*  HCT 36.1*  < > 32.0*  < > 32.9* 31.9* 32.7*  PLT 418  --  372  --   --   --  339  < > = values in this interval not displayed.  Coag's No results for input(s): APTT, INR in the last 168 hours.  Sepsis Markers  Recent Labs Lab 04/03/16 2034 04/03/16 2231  LATICACIDVEN 1.0 1.3    ABG No results for input(s): PHART, PCO2ART, PO2ART in the last 168 hours.  Liver Enzymes  Recent Labs Lab 04/03/16 1902  AST 18  ALT 12*  ALKPHOS 16*  BILITOT 0.6  ALBUMIN 3.2*    Cardiac Enzymes  Recent Labs Lab 04/03/16 2231 04/04/16 0358 04/04/16 1030  TROPONINI 0.07* 0.06* 0.06*    Glucose  Recent Labs Lab 04/03/16 2214 04/04/16 0721 04/04/16 1557  GLUCAP 94 109* 119*    Imaging No results found.   STUDIES:  None  CULTURES: Urine culture MRSA screen negative Blood cultures 2. C. difficile positive GI panel negative  ANTIBIOTICS: Vancomycin by mouth IV Flagyl  SIGNIFICANT EVENTS: 04/03/2016: Admitted for evaluation and treatment of syncope, worsening diarrhea, C. difficile positive, hypotensive  LINES/TUBES: Peripheral  IVs  DISCUSSION: 82 year old Caucasian male presenting with  ASSESSMENT / PLAN:  PULMONARY A: Recent pneumonia P:   -CXR prn -Supplemental O2 prn  CARDIOVASCULAR A:  Mildly elevated troponin Hypotension secondary to volume depletion Syncope-likely secondary to volume depletion P:  -Blood pressure improved with additional fluid boluses.  -No need for CVL at this time as patient's BP is stable and he has good peripheral access -Continue IV fluid bolus -Hemodynamics per ICU protocol  RENAL A:   AKI-Creatinine trending down with hydration Hypomagnesemia P:   -Continue IV hydration -Monitor and replace electrolytes -Monitor Is/Os  GASTROINTESTINAL A:   GERD-Reported dark colored emesis prior to admission, no signs of GIB P:   -Continue home dose of Pepsi  HEMATOLOGIC A:   Mild anemia-hemoglobin is stable P:  -Continue to monitor and transfuse per ICU protocol  INFECTIOUS A:   C. difficile colitis. Sepsis secondary to C. difficile P:   -flagyl and vancomycin po -Follow-up cultures  ENDOCRINE A:   Hypothyroidism-TSH normal  P:   -Continue current Synthroid dose  NEUROLOGIC A:   Frequent falls. Generalized debility. History of depression P:   -Strict fall precautions -PT evaluation for strength training. -Patient may require placement and self-care assistance  Disposition and family update: No family at bedside. Will update once available.  Best Practice: Code Status: DNR Diet: Regular GI prophylaxis: Chronic Pepcid use VTE prophylaxis:  SCD's / heparin  Total critical care time [facility] ANP-BC Pulmonary and Critical Care Medicine [facility] Pager [phone] or [phone] 04/05/2016, 7:11 AM

## 2016-04-05 NOTE — Progress Notes (Signed)
Patient ID: Samuel Ortiz, male   DOB: May 31, 1934, 82 y.o.   MRN: [id number] [employer] PROGRESS NOTE  Samuel Ortiz [id number] DOB: 1934/03/04 DOA: 04/03/2016 PCP: Sherrin Daisy, MD  HPI/Subjective: Patient feels very weak. Poor appetite. Not having any abdominal pain. His rectal tube was drained at 6 AM with 800 cc in it but none since.  Objective: Filed Vitals:   04/05/16 0900 04/05/16 1137  BP: 151/103 119/62  Pulse: 65 64  Temp:  98.1 F (36.7 C)  Resp: 17 20    Filed Weights   04/03/16 1849 04/03/16 2225  Weight: 104.327 kg (230 lb) 103.2 kg (227 lb 8.2 oz)    ROS: Review of Systems  Constitutional: Negative for fever and chills.  Eyes: Negative for blurred vision.  Respiratory: Negative for cough and shortness of breath.   Cardiovascular: Negative for chest pain.  Gastrointestinal: Positive for diarrhea. Negative for nausea, vomiting, abdominal pain and constipation.  Genitourinary: Negative for dysuria.  Musculoskeletal: Negative for joint pain.  Neurological: Negative for dizziness and headaches.   Exam: Physical Exam  Constitutional: He is oriented to person, place, and time.  HENT:  Nose: No mucosal edema.  Mouth/Throat: No oropharyngeal exudate or posterior oropharyngeal edema.  Eyes: Conjunctivae, EOM and lids are normal. Pupils are equal, round, and reactive to light.  Neck: No JVD present. Carotid bruit is not present. No edema present. No thyroid mass and no thyromegaly present.  Cardiovascular: S1 normal and S2 normal.  Exam reveals no gallop.   No murmur heard. Pulses:      Dorsalis pedis pulses are 2+ on the right side, and 2+ on the left side.  Respiratory: No respiratory distress. He has no wheezes. He has no rhonchi. He has no rales.  GI: Soft. Bowel sounds are normal. There is no tenderness.  Musculoskeletal:       Right ankle: He exhibits swelling.       Left ankle: He exhibits swelling.  Lymphadenopathy:    He has no  cervical adenopathy.  Neurological: He is alert and oriented to person, place, and time. No cranial nerve deficit.  Skin: Skin is warm. No rash noted. Nails show no clubbing.  Psychiatric: He has a normal mood and affect.      Data Reviewed: Basic Metabolic Panel:  Recent Labs Lab 04/03/16 1902 04/04/16 0358 04/04/16 2214 04/05/16 0420  NA 136 136 135 136  K 5.0 5.0 4.4 4.4  CL 103 104 109 111  CO2 24 25 20* 20*  GLUCOSE 107* 121* 116* 97  BUN 18 21* 21* 19  CREATININE 1.19 1.34* 1.22 1.18  CALCIUM 7.8* 7.4* 7.0* 7.3*  MG  --   --  1.5* 2.4  PHOS  --   --  2.7 2.7   Liver Function Tests:  Recent Labs Lab 04/03/16 1902  AST 18  ALT 12*  ALKPHOS 16*  BILITOT 0.6  PROT 7.3  ALBUMIN 3.2*   CBC:  Recent Labs Lab 04/03/16 1902  04/04/16 0358 04/04/16 0630 04/04/16 1513 04/04/16 2214 04/05/16 0420  WBC 22.6*  --  26.4*  --   --   --  24.7*  NEUTROABS 21.1*  --   --   --   --   --   --   HGB 11.8*  < > 10.4* 10.8* 10.7* 10.2* 10.6*  HCT 36.1*  < > 32.0* 32.2* 32.9* 31.9* 32.7*  MCV 85.9  --  86.1  --   --   --  86.1  PLT 418  --  372  --   --   --  339  < > = values in this interval not displayed. Cardiac Enzymes:  Recent Labs Lab 04/03/16 1902 04/03/16 2231 04/04/16 0358 04/04/16 1030  TROPONINI 0.05* 0.07* 0.06* 0.06*   BNP (last 3 results)  Recent Labs  04/03/16 1902  BNP 374.0*     CBG:  Recent Labs Lab 04/03/16 2214 04/04/16 0721 04/04/16 1557  GLUCAP 94 109* 119*    Recent Results (from the past 240 hour(s))  C difficile quick scan w PCR reflex     Status: Abnormal   Collection Time: 04/03/16  8:25 PM  Result Value Ref Range Status   C Diff antigen POSITIVE (A) NEGATIVE Final   C Diff toxin POSITIVE (A) NEGATIVE Final   C Diff interpretation   Final    Positive for toxigenic C. difficile, active toxin production present.    Comment: CRITICAL RESULT CALLED TO, READ BACK BY AND VERIFIED WITH: BUTCH WOODS AT 2128 04/03/16 MLZ     Gastrointestinal Panel by PCR , Stool     Status: None   Collection Time: 04/03/16  8:25 PM  Result Value Ref Range Status   Campylobacter species NOT DETECTED NOT DETECTED Final   Plesimonas shigelloides NOT DETECTED NOT DETECTED Final   Salmonella species NOT DETECTED NOT DETECTED Final   Yersinia enterocolitica NOT DETECTED NOT DETECTED Final   Vibrio species NOT DETECTED NOT DETECTED Final   Vibrio cholerae NOT DETECTED NOT DETECTED Final   Enteroaggregative E coli (EAEC) NOT DETECTED NOT DETECTED Final   Enteropathogenic E coli (EPEC) NOT DETECTED NOT DETECTED Final   Enterotoxigenic E coli (ETEC) NOT DETECTED NOT DETECTED Final   Shiga like toxin producing E coli (STEC) NOT DETECTED NOT DETECTED Final   E. coli O157 NOT DETECTED NOT DETECTED Final   Shigella/Enteroinvasive E coli (EIEC) NOT DETECTED NOT DETECTED Final   Cryptosporidium NOT DETECTED NOT DETECTED Final   Cyclospora cayetanensis NOT DETECTED NOT DETECTED Final   Entamoeba histolytica NOT DETECTED NOT DETECTED Final   Giardia lamblia NOT DETECTED NOT DETECTED Final   Adenovirus F40/41 NOT DETECTED NOT DETECTED Final   Astrovirus NOT DETECTED NOT DETECTED Final   Norovirus GI/GII NOT DETECTED NOT DETECTED Final   Rotavirus A NOT DETECTED NOT DETECTED Final   Sapovirus (I, II, IV, and V) NOT DETECTED NOT DETECTED Final  Culture, blood (routine x 2)     Status: None (Preliminary result)   Collection Time: 04/03/16  8:34 PM  Result Value Ref Range Status   Specimen Description BLOOD LEFT ASSIST CONTROL  Final   Special Requests   Final    BOTTLES DRAWN AEROBIC AND ANAEROBIC 10CCAERO,20CCANA   Culture NO GROWTH < 24 HOURS  Final   Report Status PENDING  Incomplete  Culture, blood (routine x 2)     Status: None (Preliminary result)   Collection Time: 04/03/16  8:34 PM  Result Value Ref Range Status   Specimen Description BLOOD RIGHT ASSIST CONTROL  Final   Special Requests   Final    BOTTLES DRAWN AEROBIC AND  ANAEROBIC 20CCANA,17CCANA   Culture NO GROWTH < 24 HOURS  Final   Report Status PENDING  Incomplete  MRSA PCR Screening     Status: None   Collection Time: 04/03/16 10:21 PM  Result Value Ref Range Status   MRSA by PCR NEGATIVE NEGATIVE Final    Comment:        The  GeneXpert MRSA Assay (FDA approved for NASAL specimens only), is one component of a comprehensive MRSA colonization surveillance program. It is not intended to diagnose MRSA infection nor to guide or monitor treatment for MRSA infections.   Urine culture     Status: Abnormal   Collection Time: 04/04/16 12:02 AM  Result Value Ref Range Status   Specimen Description URINE, RANDOM  Final   Special Requests Normal  Final   Culture MULTIPLE SPECIES PRESENT, SUGGEST RECOLLECTION (A)  Final   Report Status 04/05/2016 FINAL  Final     Studies: Dg Chest 2 View  04/03/2016  CLINICAL DATA:  Recent fall with hypoxia EXAM: CHEST  2 VIEW COMPARISON:  06/30/2012 FINDINGS: Cardiac shadow is mildly enlarged but stable. Postsurgical changes are again seen. The lungs are well aerated bilaterally. There is a somewhat rounded density identified in the right lung base which likely represents a confluence of infiltrative changes in the right lower and right middle lobe given the changes on the lateral projection. Some prior rib fractures with healing are noted. Postsurgical changes are noted in the left hilar region as well. IMPRESSION: Patchy changes in the right base which appear to be related to right middle and right lower lobe infiltrate when evaluated on the lateral projection. Electronically Signed   By: Inez Catalina M.D.   On: 04/03/2016 19:53    Scheduled Meds: . acidophilus  2 capsule Oral Daily  . antiseptic oral rinse  7 mL Mouth Rinse BID  . famotidine (PEPCID) IV  20 mg Intravenous Q12H  . feeding supplement (ENSURE ENLIVE)  237 mL Oral BID BM  . gabapentin  900 mg Oral TID  . heparin subcutaneous  5,000 Units Subcutaneous Q8H   . levothyroxine  200 mcg Oral QAC breakfast  . metronidazole  500 mg Intravenous Q8H  . pantoprazole  40 mg Oral Daily  . sertraline  100 mg Oral Daily  . sodium chloride flush  3 mL Intravenous Q12H  . vancomycin  250 mg Oral Q6H   Continuous Infusions: . sodium chloride 50 mL/hr at 04/05/16 1144  . norepinephrine      Assessment/Plan:  1. Clinical sepsis with C. difficile colitis, leukocytosis, fever. Hypotension. Continue oral vancomycin.  Holding antihypertensive medication. Patient's blood pressure is better than yesterday and was transferred out of the ICU this morning. Acidophilus started. Potentially rectal tube can come out if no further diarrhea by this afternoon. 2. Vomiting dark material. Hemoglobin remaining stable so far. Continue to monitor closely. Start Protonix orally and continue Pepcid IV. Unfortunately no gastroenterologist coverage over the weekend or Monday. 3. Dysphagia diet 4. Hypothyroidism unspecified on levothyroxine 5. Depression on Zoloft 6. Syncope secondary to hypotension 7. Elevated troponin likely demand ischemia from hypotension 8. Weakness. Physical therapy evaluation 9. Daughter requested palliative care consultation  Code Status:     Code Status Orders        Start     Ordered   04/03/16 2223  Do not attempt resuscitation (DNR)   Continuous    Question Answer Comment  In the event of cardiac or respiratory ARREST Do not call a "code blue"   In the event of cardiac or respiratory ARREST Do not perform Intubation, CPR, defibrillation or ACLS   In the event of cardiac or respiratory ARREST Use medication by any route, position, wound care, and other measures to relive pain and suffering. May use oxygen, suction and manual treatment of airway obstruction as needed for comfort.  04/03/16 2222    Code Status History    Date Active Date Inactive Code Status Order ID Comments User Context   This patient has a current code status but no  historical code status.    Advance Directive Documentation        Most Recent Value   Type of Advance Directive  Living will   Pre-existing out of facility DNR order (yellow form or pink MOST form)     "MOST" Form in Place?       Family Communication: Spoke with Family yesterday Disposition Plan: To be determined  Antibiotics:  Oral vancomycin  Time spent: 25 minutes  Loletha Grayer  [employer]

## 2016-04-05 NOTE — Progress Notes (Signed)
Patient transfer to rm 119 per MD order. Patient alert and oriented. 4L n/c in place, transported in bed. Report called to [facility], RN.

## 2016-04-05 NOTE — Progress Notes (Signed)
Pt had flexiseal for profuse, incontinent episodes of stool from cdiff.  CCU emptied 800 at 6 am.  He came to the floor about 1230 and had had 0 output.  Started having a lot of output around 2 pm.  At approx 1815, while cleaning pt up, flexiseal had come out w/balloon inflated.  We tried to reinsert new rectal tube but it wouldn't stay.  Put incontinence pad on him and told him we'd check frequently b/c we didn't want stool on his skin.

## 2016-04-05 NOTE — NC FL2 (Signed)
[place] LEVEL OF CARE SCREENING TOOL     IDENTIFICATION  Patient Name: Samuel Ortiz Birthdate: [birth year] Sex: male Admission Date (Current Location): 04/03/2016  [place] and [place] Number:  Engineering geologist and Address:  [facility], [address] [place], [place] [address]      Provider Number: [id number]  Attending Physician Name and Address:  Loletha Grayer, MD  Relative Name and Phone Number:       Current Level of Care: Hospital Recommended Level of Care: [place] Prior Approval Number:    Date Approved/Denied:   PASRR Number:  ([id number])  Discharge Plan: SNF    Current Diagnoses: Patient Active Problem List   Diagnosis Date Noted  . Hypotension 04/03/2016  . C. difficile diarrhea 04/03/2016  . GI bleed 04/03/2016  . Dysphagia 04/03/2016  . Syncope 04/03/2016  . Aspiration pneumonia ([place]) 04/03/2016  . Shingles 04/03/2016  . Post herpetic neuralgia 04/03/2016  . Elevated troponin 04/03/2016  . Hypothyroidism 04/03/2016  . Physical deconditioning 04/03/2016  . History of throat cancer 04/03/2016    Orientation RESPIRATION BLADDER Height & Weight     Self, Situation, Time, Place  Normal Continent Weight: 227 lb 8.2 oz (103.2 kg) Height:  6' (182.9 cm)  BEHAVIORAL SYMPTOMS/MOOD NEUROLOGICAL BOWEL NUTRITION STATUS   (None)  (None) Incontinent Diet (DYS 2)  AMBULATORY STATUS COMMUNICATION OF NEEDS Skin   Extensive Assist Verbally Normal                       Personal Care Assistance Level of Assistance  Bathing, Feeding, Dressing Bathing Assistance: Limited assistance Feeding assistance: Independent Dressing Assistance: Limited assistance     Functional Limitations Info  Sight, Hearing, Speech Sight Info: Impaired Hearing Info: Adequate Speech Info: Adequate    SPECIAL CARE FACTORS FREQUENCY  PT (By licensed PT)     PT Frequency:  (5)              Contractures       Additional Factors Info  Code Status, Allergies, Isolation Precautions Code Status Info:  (DNR) Allergies Info:  (Morphine And Related)     Isolation Precautions Info:  (Enteric precautions (UV disinfection))     Current Medications (04/05/2016):  This is the current hospital active medication list Current Facility-Administered Medications  Medication Dose Route Frequency Provider Last Rate Last Dose  . 0.9 %  sodium chloride infusion   Intravenous Continuous Loletha Grayer, MD 50 mL/hr at 04/05/16 1144    . acetaminophen (TYLENOL) tablet 650 mg  650 mg Oral Q6H PRN Debby Crosley, MD   650 mg at 04/04/16 1138   Or  . acetaminophen (TYLENOL) suppository 650 mg  650 mg Rectal Q6H PRN Debby Crosley, MD      . acidophilus (RISAQUAD) capsule 2 capsule  2 capsule Oral Daily Loletha Grayer, MD   2 capsule at 04/05/16 0933  . antiseptic oral rinse (CPC / CETYLPYRIDINIUM CHLORIDE 0.05%) solution 7 mL  7 mL Mouth Rinse BID Loletha Grayer, MD   7 mL at 04/05/16 1000  . famotidine (PEPCID) IVPB 20 mg premix  20 mg Intravenous Q12H Debby Crosley, MD   20 mg at 04/05/16 1143  . feeding supplement (ENSURE ENLIVE) (ENSURE ENLIVE) liquid 237 mL  237 mL Oral BID BM Loletha Grayer, MD   237 mL at 04/05/16 1452  . gabapentin (NEURONTIN) capsule 900 mg  900 mg Oral TID Quintella Baton, MD   900 mg  at 04/05/16 1143  . heparin injection 5,000 Units  5,000 Units Subcutaneous Q8H Mikael Spray, NP   5,000 Units at 04/05/16 1454  . HYDROcodone-acetaminophen (NORCO/VICODIN) 5-325 MG per tablet 1 tablet  1 tablet Oral Q6H PRN Quintella Baton, MD   1 tablet at 04/05/16 1204  . ipratropium-albuterol (DUONEB) 0.5-2.5 (3) MG/3ML nebulizer solution 3 mL  3 mL Nebulization Q4H PRN Debby Crosley, MD      . levothyroxine (SYNTHROID, LEVOTHROID) tablet 200 mcg  200 mcg Oral QAC breakfast Debby Crosley, MD   200 mcg at 04/05/16 0842  . metroNIDAZOLE (FLAGYL) IVPB 500 mg  500 mg Intravenous Q8H Mikael Spray, NP    500 mg at 04/05/16 1454  . norepinephrine (LEVOPHED) 4mg  in D5W 264mL premix infusion  2 mcg/min Intravenous Titrated Loletha Grayer, MD      . ondansetron [facility]) tablet 4 mg  4 mg Oral Q6H PRN Debby Crosley, MD       Or  . ondansetron (ZOFRAN) injection 4 mg  4 mg Intravenous Q6H PRN Debby Crosley, MD      . pantoprazole (PROTONIX) EC tablet 40 mg  40 mg Oral Daily Loletha Grayer, MD   40 mg at 04/05/16 0933  . senna-docusate (Senokot-S) tablet 1 tablet  1 tablet Oral QHS PRN Debby Crosley, MD      . sertraline (ZOLOFT) tablet 100 mg  100 mg Oral Daily Debby Crosley, MD   100 mg at 04/05/16 0933  . sodium chloride flush (NS) 0.9 % injection 3 mL  3 mL Intravenous Q12H Debby Crosley, MD   3 mL at 04/05/16 0934  . vancomycin (VANCOCIN) 50 mg/mL oral solution 250 mg  250 mg Oral Q6H Loletha Grayer, MD   250 mg at 04/05/16 1204     Discharge Medications: Please see discharge summary for a list of discharge medications.  Relevant Imaging Results:  Relevant Lab Results:   Additional Information  (SSN [id number])  Lorenso Quarry Kyshaun Barnette, LCSW

## 2016-04-06 LAB — BASIC METABOLIC PANEL
ANION GAP: 5 (ref 5–15)
BUN: 13 mg/dL (ref 6–20)
CO2: 20 mmol/L — ABNORMAL LOW (ref 22–32)
Calcium: 7.1 mg/dL — ABNORMAL LOW (ref 8.9–10.3)
Chloride: 110 mmol/L (ref 101–111)
Creatinine, Ser: 0.93 mg/dL (ref 0.61–1.24)
Glucose, Bld: 88 mg/dL (ref 65–99)
POTASSIUM: 3.9 mmol/L (ref 3.5–5.1)
SODIUM: 135 mmol/L (ref 135–145)

## 2016-04-06 LAB — CBC
HCT: 28.5 % — ABNORMAL LOW (ref 40.0–52.0)
Hemoglobin: 9.4 g/dL — ABNORMAL LOW (ref 13.0–18.0)
MCH: 28.1 pg (ref 26.0–34.0)
MCHC: 32.8 g/dL (ref 32.0–36.0)
MCV: 85.6 fL (ref 80.0–100.0)
PLATELETS: 315 10*3/uL (ref 150–440)
RBC: 3.33 MIL/uL — AB (ref 4.40–5.90)
RDW: 13.8 % (ref 11.5–14.5)
WBC: 17.9 10*3/uL — AB (ref 3.8–10.6)

## 2016-04-06 NOTE — Progress Notes (Signed)
Patient ID: Samuel Ortiz, male   DOB: [birth year], 82 y.o.   MRN: [id number] [facility] PROGRESS NOTE  Samuel Ortiz [id number] DOB: [birth year] DOA: 04/03/2016 PCP: Sherrin Daisy, MD  HPI/Subjective: Patient still feels weak. He states he has not had diarrhea since the rectal tube coming out. Feels okay and doesn't offer any complaints.  Objective: Filed Vitals:   04/06/16 0509 04/06/16 1330  BP: 126/86 130/68  Pulse: 66 56  Temp: 98 F (36.7 C) 98.1 F (36.7 C)  Resp: 18 20    Filed Weights   04/03/16 1849 04/03/16 2225  Weight: 104.327 kg (230 lb) 103.2 kg (227 lb 8.2 oz)    ROS: Review of Systems  Constitutional: Negative for fever and chills.  Eyes: Negative for blurred vision.  Respiratory: Negative for cough and shortness of breath.   Cardiovascular: Negative for chest pain.  Gastrointestinal: Negative for nausea, vomiting, abdominal pain, diarrhea and constipation.  Genitourinary: Negative for dysuria.  Musculoskeletal: Negative for joint pain.  Neurological: Negative for dizziness and headaches.   Exam: Physical Exam  Constitutional: He is oriented to person, place, and time.  HENT:  Nose: No mucosal edema.  Mouth/Throat: No oropharyngeal exudate or posterior oropharyngeal edema.  Eyes: Conjunctivae, EOM and lids are normal. Pupils are equal, round, and reactive to light.  Neck: No JVD present. Carotid bruit is not present. No edema present. No thyroid mass and no thyromegaly present.  Cardiovascular: S1 normal and S2 normal.  Exam reveals no gallop.   No murmur heard. Pulses:      Dorsalis pedis pulses are 2+ on the right side, and 2+ on the left side.  Respiratory: No respiratory distress. He has no wheezes. He has no rhonchi. He has no rales.  GI: Soft. Bowel sounds are normal. There is no tenderness.  Musculoskeletal:       Right ankle: He exhibits swelling.       Left ankle: He exhibits swelling.  Lymphadenopathy:    He has no cervical  adenopathy.  Neurological: He is alert and oriented to person, place, and time. No cranial nerve deficit.  Skin: Skin is warm. No rash noted. Nails show no clubbing.  Psychiatric: He has a normal mood and affect.      Data Reviewed: Basic Metabolic Panel:  Recent Labs Lab 04/03/16 1902 04/04/16 0358 04/04/16 2214 04/05/16 0420 04/06/16 0500  NA 136 136 135 136 135  K 5.0 5.0 4.4 4.4 3.9  CL 103 104 109 111 110  CO2 24 25 20* 20* 20*  GLUCOSE 107* 121* 116* 97 88  BUN 18 21* 21* 19 13  CREATININE 1.19 1.34* 1.22 1.18 0.93  CALCIUM 7.8* 7.4* 7.0* 7.3* 7.1*  MG  --   --  1.5* 2.4  --   PHOS  --   --  2.7 2.7  --    Liver Function Tests:  Recent Labs Lab 04/03/16 1902  AST 18  ALT 12*  ALKPHOS 16*  BILITOT 0.6  PROT 7.3  ALBUMIN 3.2*   CBC:  Recent Labs Lab 04/03/16 1902  04/04/16 0358 04/04/16 0630 04/04/16 1513 04/04/16 2214 04/05/16 0420 04/06/16 0500  WBC 22.6*  --  26.4*  --   --   --  24.7* 17.9*  NEUTROABS 21.1*  --   --   --   --   --   --   --   HGB 11.8*  < > 10.4* 10.8* 10.7* 10.2* 10.6* 9.4*  HCT 36.1*  < >  32.0* 32.2* 32.9* 31.9* 32.7* 28.5*  MCV 85.9  --  86.1  --   --   --  86.1 85.6  PLT 418  --  372  --   --   --  339 315  < > = values in this interval not displayed. Cardiac Enzymes:  Recent Labs Lab 04/03/16 1902 04/03/16 2231 04/04/16 0358 04/04/16 1030  TROPONINI 0.05* 0.07* 0.06* 0.06*   BNP (last 3 results)  Recent Labs  04/03/16 1902  BNP 374.0*     CBG:  Recent Labs Lab 04/03/16 2214 04/04/16 0721 04/04/16 1557  GLUCAP 94 109* 119*    Recent Results (from the past 240 hour(s))  C difficile quick scan w PCR reflex     Status: Abnormal   Collection Time: 04/03/16  8:25 PM  Result Value Ref Range Status   C Diff antigen POSITIVE (A) NEGATIVE Final   C Diff toxin POSITIVE (A) NEGATIVE Final   C Diff interpretation   Final    Positive for toxigenic C. difficile, active toxin production present.    Comment:  CRITICAL RESULT CALLED TO, READ BACK BY AND VERIFIED WITH: BUTCH WOODS AT 2128 04/03/16 MLZ    Gastrointestinal Panel by PCR , Stool     Status: None   Collection Time: 04/03/16  8:25 PM  Result Value Ref Range Status   Campylobacter species NOT DETECTED NOT DETECTED Final   Plesimonas shigelloides NOT DETECTED NOT DETECTED Final   Salmonella species NOT DETECTED NOT DETECTED Final   Yersinia enterocolitica NOT DETECTED NOT DETECTED Final   Vibrio species NOT DETECTED NOT DETECTED Final   Vibrio cholerae NOT DETECTED NOT DETECTED Final   Enteroaggregative E coli (EAEC) NOT DETECTED NOT DETECTED Final   Enteropathogenic E coli (EPEC) NOT DETECTED NOT DETECTED Final   Enterotoxigenic E coli (ETEC) NOT DETECTED NOT DETECTED Final   Shiga like toxin producing E coli (STEC) NOT DETECTED NOT DETECTED Final   E. coli O157 NOT DETECTED NOT DETECTED Final   Shigella/Enteroinvasive E coli (EIEC) NOT DETECTED NOT DETECTED Final   Cryptosporidium NOT DETECTED NOT DETECTED Final   Cyclospora cayetanensis NOT DETECTED NOT DETECTED Final   Entamoeba histolytica NOT DETECTED NOT DETECTED Final   Giardia lamblia NOT DETECTED NOT DETECTED Final   Adenovirus F40/41 NOT DETECTED NOT DETECTED Final   Astrovirus NOT DETECTED NOT DETECTED Final   Norovirus GI/GII NOT DETECTED NOT DETECTED Final   Rotavirus A NOT DETECTED NOT DETECTED Final   Sapovirus (I, II, IV, and V) NOT DETECTED NOT DETECTED Final  Culture, blood (routine x 2)     Status: None (Preliminary result)   Collection Time: 04/03/16  8:34 PM  Result Value Ref Range Status   Specimen Description BLOOD LEFT ASSIST CONTROL  Final   Special Requests   Final    BOTTLES DRAWN AEROBIC AND ANAEROBIC 10CCAERO,20CCANA   Culture NO GROWTH 3 DAYS  Final   Report Status PENDING  Incomplete  Culture, blood (routine x 2)     Status: None (Preliminary result)   Collection Time: 04/03/16  8:34 PM  Result Value Ref Range Status   Specimen Description BLOOD  RIGHT ASSIST CONTROL  Final   Special Requests   Final    BOTTLES DRAWN AEROBIC AND ANAEROBIC 20CCANA,17CCANA   Culture NO GROWTH 3 DAYS  Final   Report Status PENDING  Incomplete  MRSA PCR Screening     Status: None   Collection Time: 04/03/16 10:21 PM  Result Value Ref  Range Status   MRSA by PCR NEGATIVE NEGATIVE Final    Comment:        The GeneXpert MRSA Assay (FDA approved for NASAL specimens only), is one component of a comprehensive MRSA colonization surveillance program. It is not intended to diagnose MRSA infection nor to guide or monitor treatment for MRSA infections.   Urine culture     Status: Abnormal   Collection Time: 04/04/16 12:02 AM  Result Value Ref Range Status   Specimen Description URINE, RANDOM  Final   Special Requests Normal  Final   Culture MULTIPLE SPECIES PRESENT, SUGGEST RECOLLECTION (A)  Final   Report Status 04/05/2016 FINAL  Final     Scheduled Meds: . acidophilus  2 capsule Oral Daily  . antiseptic oral rinse  7 mL Mouth Rinse BID  . famotidine (PEPCID) IV  20 mg Intravenous Q12H  . feeding supplement (ENSURE ENLIVE)  237 mL Oral BID BM  . gabapentin  900 mg Oral TID  . levothyroxine  200 mcg Oral QAC breakfast  . pantoprazole  40 mg Oral Daily  . sertraline  100 mg Oral Daily  . sodium chloride flush  3 mL Intravenous Q12H  . vancomycin  250 mg Oral Q6H    Assessment/Plan:  1. Clinical sepsis with C. difficile colitis, leukocytosis, fever. Hypotension. Continue oral vancomycin.  Holding antihypertensive medication. Acidophilus started. Will need oral vancomycin. White blood cell count trending better. 2. Vomiting dark material. Hemoglobin drifted lower. Stop IV fluids. 3. Dysphagia diet 4. Hypothyroidism unspecified on levothyroxine 5. Depression on Zoloft 6. Syncope secondary to hypotension 7. Elevated troponin likely demand ischemia from hypotension 8. Weakness. Physical therapy evaluation. Potential rehabilitation versus home  with home health 9. Daughter requested palliative care consultation  Code Status:     Code Status Orders        Start     Ordered   04/03/16 2223  Do not attempt resuscitation (DNR)   Continuous    Question Answer Comment  In the event of cardiac or respiratory ARREST Do not call a "code blue"   In the event of cardiac or respiratory ARREST Do not perform Intubation, CPR, defibrillation or ACLS   In the event of cardiac or respiratory ARREST Use medication by any route, position, wound care, and other measures to relive pain and suffering. May use oxygen, suction and manual treatment of airway obstruction as needed for comfort.      04/03/16 2222    Code Status History    Date Active Date Inactive Code Status Order ID Comments User Context   This patient has a current code status but no historical code status.    Advance Directive Documentation        Most Recent Value   Type of Advance Directive  Living will   Pre-existing out of facility DNR order (yellow form or pink MOST form)     "MOST" Form in Place?       Family Communication: Spoke with Wife on the phone today Disposition Plan: To be determined  Antibiotics:  Oral vancomycin  Time spent: 25 minutes  Loletha Grayer  [employer]
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
## 2016-04-06 NOTE — Clinical Documentation Improvement (Signed)
Internal Medicine at [facility]  Please document query responses in the progress notes and discharge summary, no on the CDI BPA in [facility]. [place] do not deactivate query responses without responding to them first. Thank you!  Query 1 of 2 "Sepsis" is documented in the progress note dated 04/04/16 by Dr. Leslye Peer.  Please document if Sepsis was:  - present on admission  - not present on admission and developed hospital stay  - unable to clinically determine  Query 2 of 2  Please document if a condition below provides greater specificity regarding the patient's "Hypoxia" documented in the ED provider note:  - Acute Hypoxic Respiratory Failure, improved/resolved.  - Other condiiton  - Unable to clinically determine  Clinical Information: Hypoxia is documented in the ED provider notre Respiratory Rate in the mid 20's in ED Room air sat was 88% per ED provider Patient required 4 liters of oxygen via nasal cannula to maintain 02 sat per Nursing documentation in [facility] Flowsheets  Please exercise your independent, professional judgment when responding. A specific answer is not anticipated or expected.   Thank You, Erling Conte  RN BSN CCDS [phone] Health Information Management [place]

## 2016-04-06 NOTE — Evaluation (Signed)
Physical Therapy Evaluation Patient Details Name: Samuel Ortiz MRN: [id number] DOB: [birth year] Today's Date: 04/06/2016   History of Present Illness  Pt is a 82 y.o. male s/p fall OOB (pt with recent discharge from outside hospital for PNA) and presenting with diarrhea and elevated troponin (likely d/t demand ischemia).  Pt admitted with c-diff, hypotension, GI bleed and transferred out of CCU 04/04/16.  PMH includes CA, htn, shingles, postherpetic neuralgia, CABG, ORIF femur fx R.  Clinical Impression  Prior to admission, pt was independent ambulating without AD.  Pt lives with his wife in 1 level home with stairs to enter.  Currently pt is SBA supine to sit and CGA with transfers and ambulation 20 feet with RW; limited distance d/t fatigue/decreased activity tolerance.  Pt would benefit from skilled PT to address noted impairments and functional limitations.  Pt would benefit from discharge to STR (to increase independence, safety, and activity tolerance) when medically appropriate.    Follow Up Recommendations SNF    Equipment Recommendations  Rolling walker with 5" wheels    Recommendations for Other Services       Precautions / Restrictions Precautions Precautions: Fall Restrictions Weight Bearing Restrictions: No      Mobility  Bed Mobility Overal bed mobility: Needs Assistance Bed Mobility: Supine to Sit     Supine to sit: Supervision;HOB elevated        Transfers Overall transfer level: Needs assistance Equipment used: Rolling walker (2 wheeled) Transfers: Sit to/from Stand Sit to Stand: Min guard         General transfer comment: increased effort to stand; vc's required for hand and feet placement with walker use  Ambulation/Gait Ambulation/Gait assistance: Min guard Ambulation Distance (Feet): 20 Feet Assistive device: Rolling walker (2 wheeled)   Gait velocity: decreased   General Gait Details: forward flexed posture on RW requiring vc's to keep  RW closer for upright posture; limited distance d/t fatigue; mildly flexed knees during stance phase with ambulation  Stairs            Wheelchair Mobility    Modified Rankin (Stroke Patients Only)       Balance Overall balance assessment: Needs assistance Sitting-balance support: Bilateral upper extremity supported;Feet supported Sitting balance-Leahy Scale: Good     Standing balance support: Bilateral upper extremity supported (on RW) Standing balance-Leahy Scale: Fair                               Pertinent Vitals/Pain Pain Assessment: No/denies pain  Vitals stable and WFL throughout treatment session on 4 L/min O2 via nasal cannula.    Home Living Family/patient expects to be discharged to:: Private residence Living Arrangements: Spouse/significant other Available Help at Discharge: Family Type of Home: House Home Access: Stairs to enter Entrance Stairs-Rails: Left Entrance Stairs-Number of Steps: 3 Home Layout: One Parker - single point;Wheelchair - manual (SW with 4 tennis balls)      Prior Function Level of Independence: Independent         Comments: Pt denies any falls in past 6 months (other than most current fall) although MD reporting that pt has h/o multiple falls.  Per notes, pt's wife is primary caretaker.     Hand Dominance        Extremity/Trunk Assessment   Upper Extremity Assessment: Generalized weakness           Lower Extremity Assessment: Generalized weakness  Communication   Communication: No difficulties  Cognition Arousal/Alertness: Awake/alert Behavior During Therapy: WFL for tasks assessed/performed Overall Cognitive Status: Within Functional Limits for tasks assessed                      General Comments   Nursing cleared pt for participation in physical therapy.  Pt agreeable to PT session.    Exercises General Exercises - Lower Extremity Ankle Circles/Pumps:  AROM;Strengthening;Both;10 reps;Supine Quad Sets: AROM;Strengthening;Both;10 reps;Supine Short Arc Quad: AROM;Strengthening;Both;10 reps;Supine Heel Slides: AROM;Strengthening;Both;10 reps;Supine Hip ABduction/ADduction: AROM;Strengthening;Both;10 reps;Supine Straight Leg Raises: AROM;Strengthening;Both;10 reps;Supine      Assessment/Plan    PT Assessment Patient needs continued PT services  PT Diagnosis Difficulty walking;Generalized weakness   PT Problem List Decreased strength;Decreased activity tolerance;Decreased balance;Decreased mobility;Decreased knowledge of use of DME  PT Treatment Interventions DME instruction;Gait training;Stair training;Functional mobility training;Therapeutic activities;Therapeutic exercise;Balance training;Patient/family education   PT Goals (Current goals can be found in the Care Plan section) Acute Rehab PT Goals Patient Stated Goal: to go home PT Goal Formulation: With patient Time For Goal Achievement: 04/20/16 Potential to Achieve Goals: Fair    Frequency Min 2X/week   Barriers to discharge   Unsure of level of assist available    Co-evaluation               End of Session Equipment Utilized During Treatment: Gait belt;Oxygen (4 L/min via nasal cannula) Activity Tolerance: Patient limited by fatigue Patient left: in chair;with call bell/phone within reach;with chair alarm set;with SCD's reapplied Nurse Communication: Mobility status;Precautions         Time: [id number] PT Time Calculation (min) (ACUTE ONLY): 30 min   Charges:   PT Evaluation $PT Eval Moderate Complexity: 1 Procedure PT Treatments $Therapeutic Exercise: 8-22 mins   PT G CodesLeitha Bleak 04/25/2016, 2:04 PM Leitha Bleak, [place]

## 2016-04-06 NOTE — Care Management Important Message (Signed)
Important Message  Patient Details  Name: Samuel Ortiz MRN: [id number] Date of Birth: 18-Aug-1934   Medicare Important Message Given:  Yes    Shelbie Ammons, RN 04/06/2016, 9:41 AM

## 2016-04-07 DIAGNOSIS — R042 Hemoptysis: Secondary | ICD-10-CM

## 2016-04-07 DIAGNOSIS — Z87891 Personal history of nicotine dependence: Secondary | ICD-10-CM

## 2016-04-07 DIAGNOSIS — A419 Sepsis, unspecified organism: Secondary | ICD-10-CM

## 2016-04-07 DIAGNOSIS — G8929 Other chronic pain: Secondary | ICD-10-CM

## 2016-04-07 DIAGNOSIS — R112 Nausea with vomiting, unspecified: Secondary | ICD-10-CM

## 2016-04-07 DIAGNOSIS — B0229 Other postherpetic nervous system involvement: Secondary | ICD-10-CM

## 2016-04-07 DIAGNOSIS — R1110 Vomiting, unspecified: Secondary | ICD-10-CM

## 2016-04-07 DIAGNOSIS — R630 Anorexia: Secondary | ICD-10-CM

## 2016-04-07 DIAGNOSIS — R1310 Dysphagia, unspecified: Secondary | ICD-10-CM

## 2016-04-07 DIAGNOSIS — J9601 Acute respiratory failure with hypoxia: Secondary | ICD-10-CM

## 2016-04-07 DIAGNOSIS — Z8701 Personal history of pneumonia (recurrent): Secondary | ICD-10-CM

## 2016-04-07 DIAGNOSIS — K922 Gastrointestinal hemorrhage, unspecified: Secondary | ICD-10-CM

## 2016-04-07 DIAGNOSIS — E039 Hypothyroidism, unspecified: Secondary | ICD-10-CM

## 2016-04-07 DIAGNOSIS — Z515 Encounter for palliative care: Secondary | ICD-10-CM

## 2016-04-07 DIAGNOSIS — N179 Acute kidney failure, unspecified: Secondary | ICD-10-CM

## 2016-04-07 DIAGNOSIS — R197 Diarrhea, unspecified: Secondary | ICD-10-CM

## 2016-04-07 DIAGNOSIS — Z66 Do not resuscitate: Secondary | ICD-10-CM

## 2016-04-07 DIAGNOSIS — Z85810 Personal history of malignant neoplasm of tongue: Secondary | ICD-10-CM

## 2016-04-07 DIAGNOSIS — Z9114 Patient's other noncompliance with medication regimen: Secondary | ICD-10-CM

## 2016-04-07 DIAGNOSIS — F329 Major depressive disorder, single episode, unspecified: Secondary | ICD-10-CM

## 2016-04-07 DIAGNOSIS — I951 Orthostatic hypotension: Secondary | ICD-10-CM

## 2016-04-07 DIAGNOSIS — K222 Esophageal obstruction: Secondary | ICD-10-CM

## 2016-04-07 DIAGNOSIS — K219 Gastro-esophageal reflux disease without esophagitis: Secondary | ICD-10-CM

## 2016-04-07 DIAGNOSIS — E860 Dehydration: Secondary | ICD-10-CM

## 2016-04-07 LAB — CBC
HEMATOCRIT: 30 % — AB (ref 40.0–52.0)
Hemoglobin: 9.9 g/dL — ABNORMAL LOW (ref 13.0–18.0)
MCH: 27.6 pg (ref 26.0–34.0)
MCHC: 32.9 g/dL (ref 32.0–36.0)
MCV: 84 fL (ref 80.0–100.0)
Platelets: 349 10*3/uL (ref 150–440)
RBC: 3.57 MIL/uL — ABNORMAL LOW (ref 4.40–5.90)
RDW: 13.9 % (ref 11.5–14.5)
WBC: 16.4 10*3/uL — ABNORMAL HIGH (ref 3.8–10.6)

## 2016-04-07 MED ORDER — ENSURE ENLIVE PO LIQD
237.0000 mL | Freq: Three times a day (TID) | ORAL | Status: DC
  Administered 2016-04-07 – 2016-04-09 (×5): 237 mL via ORAL

## 2016-04-07 MED ORDER — FAMOTIDINE 20 MG PO TABS
20.0000 mg | ORAL_TABLET | Freq: Two times a day (BID) | ORAL | Status: DC
  Administered 2016-04-07 – 2016-04-09 (×4): 20 mg via ORAL
  Filled 2016-04-07 (×4): qty 1

## 2016-04-07 NOTE — Clinical Social Work Note (Signed)
Clinical Social Work Assessment  Patient Details  Name: Samuel Ortiz MRN: [id number] Date of Birth: 1934-10-18  Date of referral:  04/07/16               Reason for consult:  Facility Placement                Permission sought to share information with:  Chartered certified accountant granted to share information::  Yes, Verbal Permission Granted  Name::        Agency::     Relationship::     Contact Information:     Housing/Transportation Living arrangements for the past 2 months:  Single Family Home Source of Information:  Patient Patient Interpreter Needed:  None Criminal Activity/Legal Involvement Pertinent to Current Situation/Hospitalization:  No - Comment as needed Significant Relationships:  Spouse Lives with:  Spouse Do you feel safe going back to the place where you live?  Yes Need for family participation in patient care:  No (Coment)  Care giving concerns:  Patient requires assistance with ADL's and lives with spouse.    Social Worker assessment / plan:  CSW met with patient this afternoon regarding PT recommendations for STR. Patient states that he lives with his wife and that typically he is able to get around his home without an assistive device but he has a walker, cane, bedside commode, and wheelchair. Patient reports that he bought his wife a new car and that he lets her do the driving and run the errands. Patient reports that he would like a facility near [facility] if possible. CSW explained the rehab placement process and medicare coverage.   Employment status:  Retired Forensic scientist:  Medicare PT Recommendations:  [place] / Referral to community resources:  [place]  Patient/Family's Response to care:  Patient expressed appreciation for CSW assistance.  Patient/Family's Understanding of and Emotional Response to Diagnosis, Current Treatment, and Prognosis:  Patient is aware he may need some  STR and is amenable to a bedsearch.  Emotional Assessment Appearance:  Appears stated age Attitude/Demeanor/Rapport:   (pleasant and cooperative) Affect (typically observed):  Accepting, Calm Orientation:  Oriented to Self, Oriented to Place, Oriented to  Time, Oriented to Situation Alcohol / Substance use:  Not Applicable Psych involvement (Current and /or in the community):  No (Comment)  Discharge Needs  Concerns to be addressed:  Care Coordination Readmission within the last 30 days:  No Current discharge risk:  None Barriers to Discharge:  No Barriers Identified   Shela Leff, LCSW 04/07/2016, 2:11 PM

## 2016-04-07 NOTE — Progress Notes (Signed)
Patient ID: Samuel Ortiz, male   DOB: May 13, 1934, 82 y.o.   MRN: [id number] [employer] PROGRESS NOTE  Samuel Ortiz [id number] DOB: November 22, 1933 DOA: 04/03/2016 PCP: Sherrin Daisy, MD  HPI/Subjective: Patient does not offer any complaints. No abdominal pain. No bowel movement since the rectal tube was taken out.  Objective: Filed Vitals:   04/07/16 0521 04/07/16 1240  BP: 139/68 130/62  Pulse: 70 68  Temp: 98.3 F (36.8 C) 98.1 F (36.7 C)  Resp: 18 20    Filed Weights   04/03/16 1849 04/03/16 2225  Weight: 104.327 kg (230 lb) 103.2 kg (227 lb 8.2 oz)    ROS: Review of Systems  Constitutional: Negative for fever and chills.  Eyes: Negative for blurred vision.  Respiratory: Negative for cough and shortness of breath.   Cardiovascular: Negative for chest pain.  Gastrointestinal: Negative for nausea, vomiting, abdominal pain, diarrhea and constipation.  Genitourinary: Negative for dysuria.  Musculoskeletal: Negative for joint pain.  Neurological: Negative for dizziness and headaches.   Exam: Physical Exam  Constitutional: He is oriented to person, place, and time.  HENT:  Nose: No mucosal edema.  Mouth/Throat: No oropharyngeal exudate or posterior oropharyngeal edema.  Eyes: Conjunctivae, EOM and lids are normal. Pupils are equal, round, and reactive to light.  Neck: No JVD present. Carotid bruit is not present. No edema present. No thyroid mass and no thyromegaly present.  Cardiovascular: S1 normal and S2 normal.  Exam reveals no gallop.   No murmur heard. Pulses:      Dorsalis pedis pulses are 2+ on the right side, and 2+ on the left side.  Respiratory: No respiratory distress. He has no wheezes. He has no rhonchi. He has no rales.  GI: Soft. Bowel sounds are normal. There is no tenderness.  Musculoskeletal:       Right ankle: He exhibits swelling.       Left ankle: He exhibits swelling.  Lymphadenopathy:    He has no cervical adenopathy.   Neurological: He is alert and oriented to person, place, and time. No cranial nerve deficit.  Skin: Skin is warm. No rash noted. Nails show no clubbing.  Psychiatric: He has a normal mood and affect.      Data Reviewed: Basic Metabolic Panel:  Recent Labs Lab 04/03/16 1902 04/04/16 0358 04/04/16 2214 04/05/16 0420 04/06/16 0500  NA 136 136 135 136 135  K 5.0 5.0 4.4 4.4 3.9  CL 103 104 109 111 110  CO2 24 25 20* 20* 20*  GLUCOSE 107* 121* 116* 97 88  BUN 18 21* 21* 19 13  CREATININE 1.19 1.34* 1.22 1.18 0.93  CALCIUM 7.8* 7.4* 7.0* 7.3* 7.1*  MG  --   --  1.5* 2.4  --   PHOS  --   --  2.7 2.7  --    Liver Function Tests:  Recent Labs Lab 04/03/16 1902  AST 18  ALT 12*  ALKPHOS 16*  BILITOT 0.6  PROT 7.3  ALBUMIN 3.2*   CBC:  Recent Labs Lab 04/03/16 1902  04/04/16 0358  04/04/16 1513 04/04/16 2214 04/05/16 0420 04/06/16 0500 04/07/16 0436  WBC 22.6*  --  26.4*  --   --   --  24.7* 17.9* 16.4*  NEUTROABS 21.1*  --   --   --   --   --   --   --   --   HGB 11.8*  < > 10.4*  < > 10.7* 10.2* 10.6* 9.4* 9.9*  HCT  36.1*  < > 32.0*  < > 32.9* 31.9* 32.7* 28.5* 30.0*  MCV 85.9  --  86.1  --   --   --  86.1 85.6 84.0  PLT 418  --  372  --   --   --  339 315 349  < > = values in this interval not displayed. Cardiac Enzymes:  Recent Labs Lab 04/03/16 1902 04/03/16 2231 04/04/16 0358 04/04/16 1030  TROPONINI 0.05* 0.07* 0.06* 0.06*   BNP (last 3 results)  Recent Labs  04/03/16 1902  BNP 374.0*     CBG:  Recent Labs Lab 04/03/16 2214 04/04/16 0721 04/04/16 1557  GLUCAP 94 109* 119*    Recent Results (from the past 240 hour(s))  C difficile quick scan w PCR reflex     Status: Abnormal   Collection Time: 04/03/16  8:25 PM  Result Value Ref Range Status   C Diff antigen POSITIVE (A) NEGATIVE Final   C Diff toxin POSITIVE (A) NEGATIVE Final   C Diff interpretation   Final    Positive for toxigenic C. difficile, active toxin production  present.    Comment: CRITICAL RESULT CALLED TO, READ BACK BY AND VERIFIED WITH: BUTCH WOODS AT 2128 04/03/16 MLZ    Gastrointestinal Panel by PCR , Stool     Status: None   Collection Time: 04/03/16  8:25 PM  Result Value Ref Range Status   Campylobacter species NOT DETECTED NOT DETECTED Final   Plesimonas shigelloides NOT DETECTED NOT DETECTED Final   Salmonella species NOT DETECTED NOT DETECTED Final   Yersinia enterocolitica NOT DETECTED NOT DETECTED Final   Vibrio species NOT DETECTED NOT DETECTED Final   Vibrio cholerae NOT DETECTED NOT DETECTED Final   Enteroaggregative E coli (EAEC) NOT DETECTED NOT DETECTED Final   Enteropathogenic E coli (EPEC) NOT DETECTED NOT DETECTED Final   Enterotoxigenic E coli (ETEC) NOT DETECTED NOT DETECTED Final   Shiga like toxin producing E coli (STEC) NOT DETECTED NOT DETECTED Final   E. coli O157 NOT DETECTED NOT DETECTED Final   Shigella/Enteroinvasive E coli (EIEC) NOT DETECTED NOT DETECTED Final   Cryptosporidium NOT DETECTED NOT DETECTED Final   Cyclospora cayetanensis NOT DETECTED NOT DETECTED Final   Entamoeba histolytica NOT DETECTED NOT DETECTED Final   Giardia lamblia NOT DETECTED NOT DETECTED Final   Adenovirus F40/41 NOT DETECTED NOT DETECTED Final   Astrovirus NOT DETECTED NOT DETECTED Final   Norovirus GI/GII NOT DETECTED NOT DETECTED Final   Rotavirus A NOT DETECTED NOT DETECTED Final   Sapovirus (I, II, IV, and V) NOT DETECTED NOT DETECTED Final  Culture, blood (routine x 2)     Status: None (Preliminary result)   Collection Time: 04/03/16  8:34 PM  Result Value Ref Range Status   Specimen Description BLOOD LEFT ASSIST CONTROL  Final   Special Requests   Final    BOTTLES DRAWN AEROBIC AND ANAEROBIC 10CCAERO,20CCANA   Culture NO GROWTH 4 DAYS  Final   Report Status PENDING  Incomplete  Culture, blood (routine x 2)     Status: None (Preliminary result)   Collection Time: 04/03/16  8:34 PM  Result Value Ref Range Status    Specimen Description BLOOD RIGHT ASSIST CONTROL  Final   Special Requests   Final    BOTTLES DRAWN AEROBIC AND ANAEROBIC 20CCANA,17CCANA   Culture NO GROWTH 4 DAYS  Final   Report Status PENDING  Incomplete  MRSA PCR Screening     Status: None  Collection Time: 04/03/16 10:21 PM  Result Value Ref Range Status   MRSA by PCR NEGATIVE NEGATIVE Final    Comment:        The GeneXpert MRSA Assay (FDA approved for NASAL specimens only), is one component of a comprehensive MRSA colonization surveillance program. It is not intended to diagnose MRSA infection nor to guide or monitor treatment for MRSA infections.   Urine culture     Status: Abnormal   Collection Time: 04/04/16 12:02 AM  Result Value Ref Range Status   Specimen Description URINE, RANDOM  Final   Special Requests Normal  Final   Culture MULTIPLE SPECIES PRESENT, SUGGEST RECOLLECTION (A)  Final   Report Status 04/05/2016 FINAL  Final     Scheduled Meds: . acidophilus  2 capsule Oral Daily  . antiseptic oral rinse  7 mL Mouth Rinse BID  . famotidine  20 mg Oral BID  . feeding supplement (ENSURE ENLIVE)  237 mL Oral BID BM  . gabapentin  900 mg Oral TID  . levothyroxine  200 mcg Oral QAC breakfast  . pantoprazole  40 mg Oral Daily  . sertraline  100 mg Oral Daily  . sodium chloride flush  3 mL Intravenous Q12H  . vancomycin  250 mg Oral Q6H    Assessment/Plan:  1. Clinical sepsis Present on admission with C. difficile colitis, leukocytosis, fever. Hypotension. Continue oral vancomycin.  Holding antihypertensive medication. Acidophilus started. Will need oral vancomycin upon discharge. White blood cell count trending better. 2. Vomiting dark material. Hemoglobin drifted lower. 3. Dysphagia diet 4. Hypothyroidism unspecified on levothyroxine 5. Depression on Zoloft 6. Syncope secondary to hypotension 7. Elevated troponin likely demand ischemia from hypotension 8. Weakness. Physical therapy evaluation. Possible  discharge to rehabilitation tomorrow 9. Daughter requested palliative care consultation  Code Status:     Code Status Orders        Start     Ordered   04/03/16 2223  Do not attempt resuscitation (DNR)   Continuous    Question Answer Comment  In the event of cardiac or respiratory ARREST Do not call a "code blue"   In the event of cardiac or respiratory ARREST Do not perform Intubation, CPR, defibrillation or ACLS   In the event of cardiac or respiratory ARREST Use medication by any route, position, wound care, and other measures to relive pain and suffering. May use oxygen, suction and manual treatment of airway obstruction as needed for comfort.      04/03/16 2222    Code Status History    Date Active Date Inactive Code Status Order ID Comments User Context   This patient has a current code status but no historical code status.    Advance Directive Documentation        Most Recent Value   Type of Advance Directive  Living will   Pre-existing out of facility DNR order (yellow form or pink MOST form)     "MOST" Form in Place?       Family Communication: Left message for wife on the phone Disposition Plan: potentially out to rehabilitation tomorrow  Antibiotics:  Oral vancomycin  Time spent: 24 minutes  Loletha Grayer  [employer]

## 2016-04-07 NOTE — Progress Notes (Signed)
Speech Therapy Note: reviewed chart notes; consulted NSG re: pt's status today. NSG stated pt is sipping at liquids and taking a few bites of foods w/ no overt s/s of aspiration noted but no significant oral intake at times - he appears disinterested. Noted GI status. Recommend continue w/ current diet consistency d/t noted GI/Esophageal dysmotility w/ general aspiration precautions. Dietician is following for nutritional supplements; noted Palliative Care consult w/ family. NSG to reconsult if any change in status; NSG agreed.

## 2016-04-08 ENCOUNTER — Inpatient Hospital Stay: Payer: Medicare Other

## 2016-04-08 LAB — CULTURE, BLOOD (ROUTINE X 2)
Culture: NO GROWTH
Culture: NO GROWTH

## 2016-04-08 LAB — CBC
HEMATOCRIT: 32.8 % — AB (ref 40.0–52.0)
Hemoglobin: 10.8 g/dL — ABNORMAL LOW (ref 13.0–18.0)
MCH: 27.6 pg (ref 26.0–34.0)
MCHC: 32.8 g/dL (ref 32.0–36.0)
MCV: 84 fL (ref 80.0–100.0)
Platelets: 377 10*3/uL (ref 150–440)
RBC: 3.9 MIL/uL — AB (ref 4.40–5.90)
RDW: 14.2 % (ref 11.5–14.5)
WBC: 16.3 10*3/uL — AB (ref 3.8–10.6)

## 2016-04-08 MED ORDER — PANTOPRAZOLE SODIUM 40 MG PO TBEC
40.0000 mg | DELAYED_RELEASE_TABLET | Freq: Two times a day (BID) | ORAL | Status: DC
  Administered 2016-04-08 – 2016-04-09 (×3): 40 mg via ORAL
  Filled 2016-04-08 (×3): qty 1

## 2016-04-08 MED ORDER — CARVEDILOL 3.125 MG PO TABS
3.1250 mg | ORAL_TABLET | Freq: Two times a day (BID) | ORAL | Status: DC
  Administered 2016-04-08 – 2016-04-09 (×2): 3.125 mg via ORAL
  Filled 2016-04-08 (×2): qty 1

## 2016-04-08 MED ORDER — BUPIVACAINE HCL (PF) 0.5 % IJ SOLN
10.0000 mL | Freq: Once | INTRAMUSCULAR | Status: AC
  Administered 2016-04-08: 10 mL
  Filled 2016-04-08: qty 10

## 2016-04-08 MED ORDER — PREDNISONE 20 MG PO TABS
20.0000 mg | ORAL_TABLET | Freq: Every day | ORAL | Status: DC
  Administered 2016-04-08: 09:00:00 20 mg via ORAL
  Filled 2016-04-08: qty 1

## 2016-04-08 MED ORDER — LISINOPRIL 10 MG PO TABS
10.0000 mg | ORAL_TABLET | Freq: Every day | ORAL | Status: DC
  Administered 2016-04-08: 17:00:00 10 mg via ORAL
  Filled 2016-04-08 (×2): qty 1

## 2016-04-08 MED ORDER — TRIAMCINOLONE ACETONIDE 40 MG/ML IJ SUSP
40.0000 mg | Freq: Once | INTRAMUSCULAR | Status: AC
  Administered 2016-04-08: 13:00:00 40 mg via INTRA_ARTICULAR
  Filled 2016-04-08: qty 1

## 2016-04-08 NOTE — Consult Note (Signed)
Consultation  Referring Provider:     Dr Ernesto Rutherford Admit date: 04/03/16 Consult date      04/08/16   Reason for Consultation:     UGIB         HPI:   Samuel Ortiz is a 82 y.o. male with history of esophageal stricture requiring dilation, CAD/CABG, throat cancer post radiation, GERD, HTN, HL, depression who was hospitalized 03/21/16 at [facility] for Sepsis/ RLL Pneumonia (possibly aspiration related) treated with multiple antibiotics/possible NSTEMI, and admitted here 5d ago after fall and problematic diarrhea/NV. Was found to have C-diff and treated with vacnomycin and flagyl. Reported emesis to be black at home, so he was started on bid Pepcid IV, and hemoglobin was followed. GI consultation was ordered, however the request did not come to a GI provider until today (5d later).  Evidently he was to be discharged, developed shoulder pain and was also seen by orthopedics today. Since admission patient's hemoglobin has been stable from 9.4-11.8. His hgn when at [facility] was about the same at discharge. WBC has been elevated. VUn and creatinine were slightly elevated at admission, now normal.  Troponins have been elevated here.  His home medications include mobic. Has not been on PPI or H2RA at home.  His current inpatient meds now include Pantoprazole 40mg  po qd and Pepcid 20mg  po bid. Patient reports today he is feeling much better. Says he has had no NV since admission and although his stools are still loose, the frequency has improved significantly. Has had one stool today. Denies abdominal pain. States he swallows well as long as he has liquid type nutrition. He wants to know when he can go home.   PREVIOUS ENDOSCOPIES:            Colonoscopy attempt 2016, aborted to bad prep. Patient refused to reschedule. Colonoscopy 2011 without polyps. I do not know when his las EGD was. I saw patient last July for colon cancer screening and dysphagia, however patient stated that although he had an esophgeal stricture in  past that required dilation, he had no problems with this and refused to discuss it further.    Past Medical History  Diagnosis Date  . Cancer ([place])   . Hypothyroidism   . GERD (gastroesophageal reflux disease)   . Hypertension   . Arthritis   . Depression   . Shingles   . Vitamin B 12 deficiency   . Hypercholesterolemia   . Postherpetic neuralgia     Past Surgical History  Procedure Laterality Date  . Tonsillectomy    . Coronary artery bypass graft    . Throat cancer      resection of cancer  . Fracture surgery    . Orif femur fraction Right     No family history on file. Father had CVA, mother had unknown cancer.  Social History  Substance Use Topics  . Smoking status: Former Smoker    Quit date: 03/19/1961  . Smokeless tobacco: None  . Alcohol Use: No    Prior to Admission medications   Medication Sig Start Date End Date Taking? Authorizing Provider  albuterol (PROVENTIL HFA;VENTOLIN HFA) 108 (90 Base) MCG/ACT inhaler Inhale 2 puffs into the lungs every 6 (six) hours as needed for wheezing or shortness of breath.   Yes Historical Provider, MD  ferrous gluconate (FERGON) 324 MG tablet Take 324 mg by mouth daily with breakfast.   Yes Historical Provider, MD  gabapentin (NEURONTIN) 300 MG capsule Take 900 mg by mouth 3 (three)  times daily.    Yes Historical Provider, MD  HYDROcodone-acetaminophen (NORCO/VICODIN) 5-325 MG per tablet Take 1 tablet by mouth every 6 (six) hours as needed for moderate pain.   Yes Historical Provider, MD  levothyroxine (SYNTHROID, LEVOTHROID) 200 MCG tablet Take 200 mcg by mouth daily before breakfast.   Yes Historical Provider, MD  meloxicam (MOBIC) 7.5 MG tablet Take 7.5 mg by mouth daily.   Yes Historical Provider, MD  omega-3 acid ethyl esters (LOVAZA) 1 G capsule Take 1 g by mouth daily.   Yes Historical Provider, MD  sertraline (ZOLOFT) 100 MG tablet Take 100 mg by mouth daily.   Yes Historical Provider, MD  simvastatin (ZOCOR) 20 MG  tablet Take 20 mg by mouth at bedtime.    Yes Historical Provider, MD  vitamin B-12 (CYANOCOBALAMIN) 1000 MCG tablet Take 1,000 mcg by mouth daily.   Yes Historical Provider, MD  carvedilol (COREG) 3.125 MG tablet Take 3.125 mg by mouth 2 (two) times daily with a meal. Reported on 04/07/2016    Historical Provider, MD  lisinopril (PRINIVIL,ZESTRIL) 20 MG tablet Take 20 mg by mouth daily. Reported on 04/07/2016    Historical Provider, MD    Current Facility-Administered Medications  Medication Dose Route Frequency Provider Last Rate Last Dose  . acetaminophen (TYLENOL) tablet 650 mg  650 mg Oral Q6H PRN Debby Crosley, MD   650 mg at 04/04/16 1138   Or  . acetaminophen (TYLENOL) suppository 650 mg  650 mg Rectal Q6H PRN Debby Crosley, MD      . acidophilus (RISAQUAD) capsule 2 capsule  2 capsule Oral Daily Loletha Grayer, MD   2 capsule at 04/08/16 1233  . antiseptic oral rinse (CPC / CETYLPYRIDINIUM CHLORIDE 0.05%) solution 7 mL  7 mL Mouth Rinse BID Loletha Grayer, MD   7 mL at 04/08/16 1000  . bupivacaine (MARCAINE) 0.5 % injection 10 mL  10 mL Infiltration Once Hessie Knows, MD      . carvedilol (COREG) tablet 3.125 mg  3.125 mg Oral BID WC Loletha Grayer, MD      . famotidine (PEPCID) tablet 20 mg  20 mg Oral BID Loletha Grayer, MD   20 mg at 04/08/16 1233  . feeding supplement (ENSURE ENLIVE) (ENSURE ENLIVE) liquid 237 mL  237 mL Oral TID BM Colleen Can, MD   237 mL at 04/08/16 1000  . gabapentin (NEURONTIN) capsule 900 mg  900 mg Oral TID Quintella Baton, MD   900 mg at 04/08/16 1233  . HYDROcodone-acetaminophen (NORCO/VICODIN) 5-325 MG per tablet 1 tablet  1 tablet Oral Q6H PRN Quintella Baton, MD   1 tablet at 04/08/16 0525  . ipratropium-albuterol (DUONEB) 0.5-2.5 (3) MG/3ML nebulizer solution 3 mL  3 mL Nebulization Q4H PRN Debby Crosley, MD      . levothyroxine (SYNTHROID, LEVOTHROID) tablet 200 mcg  200 mcg Oral QAC breakfast Debby Crosley, MD   200 mcg at 04/08/16 0845  .  lisinopril (PRINIVIL,ZESTRIL) tablet 10 mg  10 mg Oral Daily Loletha Grayer, MD      . ondansetron [facility]) tablet 4 mg  4 mg Oral Q6H PRN Debby Crosley, MD       Or  . ondansetron (ZOFRAN) injection 4 mg  4 mg Intravenous Q6H PRN Debby Crosley, MD      . pantoprazole (PROTONIX) EC tablet 40 mg  40 mg Oral Daily Loletha Grayer, MD   40 mg at 04/08/16 1233  . predniSONE (DELTASONE) tablet 20 mg  20 mg Oral Q  breakfast Loletha Grayer, MD   20 mg at 04/08/16 0902  . senna-docusate (Senokot-S) tablet 1 tablet  1 tablet Oral QHS PRN Debby Crosley, MD      . sertraline (ZOLOFT) tablet 100 mg  100 mg Oral Daily Debby Crosley, MD   100 mg at 04/08/16 1233  . sodium chloride flush (NS) 0.9 % injection 3 mL  3 mL Intravenous Q12H Debby Crosley, MD   3 mL at 04/08/16 1234  . triamcinolone acetonide (KENALOG-40) injection 40 mg  40 mg Intra-articular Once Hessie Knows, MD      . vancomycin (VANCOCIN) 50 mg/mL oral solution 250 mg  250 mg Oral Q6H Loletha Grayer, MD   250 mg at 04/08/16 0525    Allergies as of 04/03/2016 - Review Complete 04/03/2016  Allergen Reaction Noted  . Morphine and related Other (See Comments) 06/20/2015     Review of Systems:    All systems reviewed and negative except where noted in HPI. With the excpetion of memory loss, depression, right shoulder pain, fatigue, cough. It is of note that while hospitalized at [facility] he may have had NSTEMI as his troponins were elevated- although this was not definite, and he does have history of CAD/CABG. He also underwent CTA chest for PE while hospitalized for pneumonia/sepsis which did not demonstrate PE, but did show significant RLL pneumonia and a noncontrasted CT was recommended in 27m to assure clearance of the pneumonia.     Physical Exam:  Vital signs in last 24 hours: Temp:  [97.4 F (36.3 C)-97.9 F (36.6 C)] 97.4 F (36.3 C) (06/14 0520) Pulse Rate:  [73-74] 74 (06/14 0520) Resp:  [16-18] 16 (06/14 0520) BP:  (159-170)/(77-80) 170/77 mmHg (06/14 0520) SpO2:  [93 %-97 %] 97 % (06/14 0520) Last BM Date: 04/07/16 General:   Somewhat flat elderly man in NAD Head:  Normocephalic and atraumatic. Eyes:   No icterus.   Conjunctiva pink. Ears:  Normal auditory acuity. Mouth: Mucosa pink moist, no lesions. Neck:  Supple; no masses felt Lungs:  Respirations even and unlabored. Lungs clear to auscultation bilaterally.   No wheezes, crackles, or rhonchi.  Heart:  S1S2, RRR, no MRG. No edema. Abdomen:   Flat, soft, nondistended, nontender. Normal bowel sounds. No appreciable masses or hepatomegaly. No rebound signs or other peritoneal signs..  Msk:  MAEW x4, No clubbing or cyanosis. Strength 4/5. Symmetrical without gross deformities. Neurologic:  Alert and  oriented x4;  Cranial nerves II-XII intact.  Skin:  Warm, dry, pink without significant lesions or rashes. Psych:  Alert and cooperative.   LAB RESULTS:  Recent Labs  04/06/16 0500 04/07/16 0436 04/08/16 0558  WBC 17.9* 16.4* 16.3*  HGB 9.4* 9.9* 10.8*  HCT 28.5* 30.0* 32.8*  PLT 315 349 377   BMET  Recent Labs  04/06/16 0500  NA 135  K 3.9  CL 110  CO2 20*  GLUCOSE 88  BUN 13  CREATININE 0.93  CALCIUM 7.1*   LFT No results for input(s): PROT, ALBUMIN, AST, ALT, ALKPHOS, BILITOT, BILIDIR, IBILI in the last 72 hours. PT/INR No results for input(s): LABPROT, INR in the last 72 hours.  STUDIES: Dg Shoulder Right  04/08/2016  CLINICAL DATA:  Acute right shoulder pain without known injury. Initial encounter. EXAM: RIGHT SHOULDER - 2+ VIEW COMPARISON:  None. FINDINGS: There is no evidence of fracture or dislocation. Degenerative joint disease of the right acromioclavicular joint is noted. Mild osteophyte formation is seen involving the right glenohumeral joint. Soft tissues are  unremarkable. IMPRESSION: Degenerative joint disease of the right acromioclavicular and glenohumeral joints is noted. No acute abnormality seen in the right  shoulder. Electronically Signed   By: Marijo Conception, M.D.   On: 04/08/2016 09:24       Impression / Plan:   1. Possible hematemesis/UGIB: this could be concerning for PUD in the setting of recent critical illness and chronic NSAID use. He has had no further emesis since admission and his hemoglobin is very stable/ His hemodynamic instability on admission has improved with hydration and treatment of his C-diff. There has been no melena: stools have been noted to be brown and green, with 2 clay colored stools 6/10. Would recommend avoiding all NSAIDs and continuing PPI therapy. Will discuss EGD with Dr Gustavo Lah. It is on note that he is a higher risk candidate given his recent RLL pneumonia and elevated troponins, possible NSTEMI at [facility]. May benefit from cardiology opinion prior to sedated procedures.    Thank you very much for this consult. These services were provided by Stephens November, NP-C, in collaboration with Lollie Sails, MD, with whom I have discussed this patient in full.   Stephens November, NP-C

## 2016-04-08 NOTE — Progress Notes (Signed)
  Nutrition Follow-up  DOCUMENTATION CODES:   Not applicable  INTERVENTION:   Cater to pt preferences as pt diet order now Dysphagia I per preference Continue Ensure as pt drinks well. Will add Mighty Shakes to pt trays as pt drinks very well. Recommend collecting new weight    NUTRITION DIAGNOSIS:   Inadequate oral intake related to poor appetite, dysphagia as evidenced by per patient/family report; being addressed with supplements and diet preferences  GOAL:   Patient will meet greater than or equal to 90% of their needs; ongoing  MONITOR:   PO intake, Supplement acceptance, Weight trends  REASON FOR ASSESSMENT:   Consult Poor PO  ASSESSMENT:    82 yo male admitted with hypotension, aspiration pneumonia, dysphagia. Pt with hx of throat cancer s/p radiation. Pt with diarrhea, C.diff positive  Pt remains on isolation. Per Ortho consult, pt with significant glenohumeral osteoarthritis of right shoulder. Possible discharge to rehab tomorrow per MD note.  Diet Order:  DIET - DYS 1 Room service appropriate?: Yes with Assist; Fluid consistency:: Thin    Current Nutrition: On rounds this am, pt breakfast tray remained untouched however pt reports he drank his Ensure. Pt had not eaten lunch on visit this afternoon, reports he has 'not gotten to it yet.'    Gastrointestinal Profile: Last BM: 04/08/2016  Medications: Prednisone, Protonix, Pepcid Labs: reviewed   Nutrition-Focused Physical Exam Findings:  Unable to complete Nutrition-Focused physical exam at this time. Pt reported to RD that he  had just soiled the bed linens, aids coming to assist.   Weight Trend since Admission: Filed Weights   04/03/16 1849 04/03/16 2225  Weight: 230 lb (104.327 kg) 227 lb 8.2 oz (103.2 kg)     Skin:  Reviewed, no issues   BMI:  Body mass index is 30.85 kg/(m^2).  Estimated Nutritional Needs:   Kcal:  2200-2500 kcals  Protein:  100-120 g  Fluid:  </= 2 L  EDUCATION  NEEDS:   No education needs identified at this time  Dwyane Luo, RD, LDN Pager [phone] Weekend/On-Call Pager [phone]

## 2016-04-08 NOTE — Op Note (Signed)
Right shoulder injection.  Indication: Right shoulder pain and glenohumeral advanced osteoarthritis  After informed consent was obtained and timeout procedure completed, the anterior shoulder was prepped with chlorhexidine and a 21-gauge 1 and a ventricular needle was inserted into the shoulder joint with injection of 40 mg Kenalog 5 cc of half percent Sensorcaine without epinephrine. The needle was then withdrawn and a Band-Aid applied without any drainage or bleeding from the site. Patient tolerated procedure well

## 2016-04-08 NOTE — Progress Notes (Signed)
Physical Therapy Treatment Patient Details Name: Samuel Ortiz MRN: [id number] DOB: [birth year] Today's Date: 04/08/2016    History of Present Illness Pt is a 82 y.o. male s/p fall OOB (pt with recent discharge from outside hospital for PNA) and presenting with diarrhea and elevated troponin (likely d/t demand ischemia).  Pt admitted with c-diff, hypotension, GI bleed and transferred out of CCU 04/04/16.  PMH includes CA, htn, shingles, postherpetic neuralgia, CABG, ORIF femur fx R.    PT Comments    Initial attempt, pt with nursing for personal hygiene. Second attempt, pt reluctantly agreeable to PT for supine bed exercises only. Pt reports 10+ pain in right shoulder and denies other pain until attempting exercises on Right lower extremity. Pt demonstrates good range of motion on Left lower extremity for exercises and participates well in bilateral isometric exercises, but unable to demonstrate much active range of motion on right and refuses therapist to touch Right lower extremity for active assisted range of motion. Continue PT to progress range and strength to be able to participate in functional mobility for up in bed and out of bed.   Follow Up Recommendations  SNF     Equipment Recommendations  Rolling walker with 5" wheels    Recommendations for Other Services       Precautions / Restrictions Restrictions Weight Bearing Restrictions: No    Mobility  Bed Mobility               General bed mobility comments: Refused up in bed/out of bed  Transfers                    Ambulation/Gait                 Stairs            Wheelchair Mobility    Modified Rankin (Stroke Patients Only)       Balance                                    Cognition Arousal/Alertness: Lethargic Behavior During Therapy: WFL for tasks assessed/performed Overall Cognitive Status: Within Functional Limits for tasks assessed                       Exercises General Exercises - Lower Extremity Ankle Circles/Pumps: AROM;Both;20 reps;Supine Quad Sets: Strengthening;Both;10 reps;Supine Short Arc Quad: AROM;Left;10 reps;Supine (Refused on R) Heel Slides: AROM;Left;10 reps;Supine (partial range R 3x with pain; refused AAROM) Hip ABduction/ADduction: AROM;Left;10 reps;Supine (unable R; refused AAROM) Straight Leg Raises: AROM;Strengthening;Left;10 reps;Supine (unable R; refuses AAROM)    General Comments        Pertinent Vitals/Pain Pain Assessment: 0-10 (8/10 RLE) Pain Score: 10-Worst pain ever Pain Location: R shoulder; whole R leg Pain Intervention(s): Monitored during session;Limited activity within patient's tolerance    Home Living                      Prior Function            PT Goals (current goals can now be found in the care plan section) Progress towards PT goals: Not progressing toward goals - comment    Frequency  Min 2X/week    PT Plan Current plan remains appropriate    Co-evaluation             End of Session   Activity Tolerance: Patient limited  by pain Patient left: in bed;with call bell/phone within reach;with bed alarm set     Time: 1142-1159 PT Time Calculation (min) (ACUTE ONLY): 17 min  Charges:  $Therapeutic Exercise: 8-22 mins                    G Codes:      Charlaine Dalton, PTA 04/08/2016, 12:33 PM

## 2016-04-08 NOTE — Progress Notes (Signed)
Patient ID: Samuel Ortiz, male   DOB: 10-03-1934, 82 y.o.   MRN: [id number] Patient ID: BREK GWINN, male   DOB: Mar 04, 1934, 82 y.o.   MRN: [id number] [employer] PROGRESS NOTE  DAEGAN RYCROFT [id number] DOB: 08/18/1934 DOA: 04/03/2016 PCP: Sherrin Daisy, MD  HPI/Subjective: On the day that I was planning on discharge the patient complains of severe right shoulder pain and immobility going on for the past week.  Objective: Filed Vitals:   04/07/16 2121 04/08/16 0520  BP: 159/80 170/77  Pulse: 73 74  Temp: 97.9 F (36.6 C) 97.4 F (36.3 C)  Resp: 18 16    Filed Weights   04/03/16 1849 04/03/16 2225  Weight: 104.327 kg (230 lb) 103.2 kg (227 lb 8.2 oz)    ROS: Review of Systems  Constitutional: Negative for fever and chills.  Eyes: Negative for blurred vision.  Respiratory: Negative for cough and shortness of breath.   Cardiovascular: Negative for chest pain.  Gastrointestinal: Negative for nausea, vomiting, abdominal pain, diarrhea and constipation.  Genitourinary: Negative for dysuria.  Musculoskeletal: Positive for joint pain.  Neurological: Negative for dizziness and headaches.   Exam: Physical Exam  Constitutional: He is oriented to person, place, and time.  HENT:  Nose: No mucosal edema.  Mouth/Throat: No oropharyngeal exudate or posterior oropharyngeal edema.  Eyes: Conjunctivae, EOM and lids are normal. Pupils are equal, round, and reactive to light.  Neck: No JVD present. Carotid bruit is not present. No edema present. No thyroid mass and no thyromegaly present.  Cardiovascular: S1 normal and S2 normal.  Exam reveals no gallop.   No murmur heard. Pulses:      Dorsalis pedis pulses are 2+ on the right side, and 2+ on the left side.  Respiratory: No respiratory distress. He has no wheezes. He has no rhonchi. He has no rales.  GI: Soft. Bowel sounds are normal. There is no tenderness.  Musculoskeletal:       Right ankle: He exhibits swelling.        Left ankle: He exhibits swelling.  Lymphadenopathy:    He has no cervical adenopathy.  Neurological: He is alert and oriented to person, place, and time. No cranial nerve deficit.  Skin: Skin is warm. No rash noted. Nails show no clubbing.  Psychiatric: He has a normal mood and affect.      Data Reviewed: Basic Metabolic Panel:  Recent Labs Lab 04/03/16 1902 04/04/16 0358 04/04/16 2214 04/05/16 0420 04/06/16 0500  NA 136 136 135 136 135  K 5.0 5.0 4.4 4.4 3.9  CL 103 104 109 111 110  CO2 24 25 20* 20* 20*  GLUCOSE 107* 121* 116* 97 88  BUN 18 21* 21* 19 13  CREATININE 1.19 1.34* 1.22 1.18 0.93  CALCIUM 7.8* 7.4* 7.0* 7.3* 7.1*  MG  --   --  1.5* 2.4  --   PHOS  --   --  2.7 2.7  --    Liver Function Tests:  Recent Labs Lab 04/03/16 1902  AST 18  ALT 12*  ALKPHOS 16*  BILITOT 0.6  PROT 7.3  ALBUMIN 3.2*   CBC:  Recent Labs Lab 04/03/16 1902  04/04/16 0358  04/04/16 2214 04/05/16 0420 04/06/16 0500 04/07/16 0436 04/08/16 0558  WBC 22.6*  --  26.4*  --   --  24.7* 17.9* 16.4* 16.3*  NEUTROABS 21.1*  --   --   --   --   --   --   --   --  HGB 11.8*  < > 10.4*  < > 10.2* 10.6* 9.4* 9.9* 10.8*  HCT 36.1*  < > 32.0*  < > 31.9* 32.7* 28.5* 30.0* 32.8*  MCV 85.9  --  86.1  --   --  86.1 85.6 84.0 84.0  PLT 418  --  372  --   --  339 315 349 377  < > = values in this interval not displayed. Cardiac Enzymes:  Recent Labs Lab 04/03/16 1902 04/03/16 2231 04/04/16 0358 04/04/16 1030  TROPONINI 0.05* 0.07* 0.06* 0.06*   BNP (last 3 results)  Recent Labs  04/03/16 1902  BNP 374.0*     CBG:  Recent Labs Lab 04/03/16 2214 04/04/16 0721 04/04/16 1557  GLUCAP 94 109* 119*    Recent Results (from the past 240 hour(s))  C difficile quick scan w PCR reflex     Status: Abnormal   Collection Time: 04/03/16  8:25 PM  Result Value Ref Range Status   C Diff antigen POSITIVE (A) NEGATIVE Final   C Diff toxin POSITIVE (A) NEGATIVE Final   C  Diff interpretation   Final    Positive for toxigenic C. difficile, active toxin production present.    Comment: CRITICAL RESULT CALLED TO, READ BACK BY AND VERIFIED WITH: BUTCH WOODS AT 2128 04/03/16 MLZ    Gastrointestinal Panel by PCR , Stool     Status: None   Collection Time: 04/03/16  8:25 PM  Result Value Ref Range Status   Campylobacter species NOT DETECTED NOT DETECTED Final   Plesimonas shigelloides NOT DETECTED NOT DETECTED Final   Salmonella species NOT DETECTED NOT DETECTED Final   Yersinia enterocolitica NOT DETECTED NOT DETECTED Final   Vibrio species NOT DETECTED NOT DETECTED Final   Vibrio cholerae NOT DETECTED NOT DETECTED Final   Enteroaggregative E coli (EAEC) NOT DETECTED NOT DETECTED Final   Enteropathogenic E coli (EPEC) NOT DETECTED NOT DETECTED Final   Enterotoxigenic E coli (ETEC) NOT DETECTED NOT DETECTED Final   Shiga like toxin producing E coli (STEC) NOT DETECTED NOT DETECTED Final   E. coli O157 NOT DETECTED NOT DETECTED Final   Shigella/Enteroinvasive E coli (EIEC) NOT DETECTED NOT DETECTED Final   Cryptosporidium NOT DETECTED NOT DETECTED Final   Cyclospora cayetanensis NOT DETECTED NOT DETECTED Final   Entamoeba histolytica NOT DETECTED NOT DETECTED Final   Giardia lamblia NOT DETECTED NOT DETECTED Final   Adenovirus F40/41 NOT DETECTED NOT DETECTED Final   Astrovirus NOT DETECTED NOT DETECTED Final   Norovirus GI/GII NOT DETECTED NOT DETECTED Final   Rotavirus A NOT DETECTED NOT DETECTED Final   Sapovirus (I, II, IV, and V) NOT DETECTED NOT DETECTED Final  Culture, blood (routine x 2)     Status: None   Collection Time: 04/03/16  8:34 PM  Result Value Ref Range Status   Specimen Description BLOOD LEFT ASSIST CONTROL  Final   Special Requests   Final    BOTTLES DRAWN AEROBIC AND ANAEROBIC 10CCAERO,20CCANA   Culture NO GROWTH 5 DAYS  Final   Report Status 04/08/2016 FINAL  Final  Culture, blood (routine x 2)     Status: None   Collection Time:  04/03/16  8:34 PM  Result Value Ref Range Status   Specimen Description BLOOD RIGHT ASSIST CONTROL  Final   Special Requests   Final    BOTTLES DRAWN AEROBIC AND ANAEROBIC 20CCANA,17CCANA   Culture NO GROWTH 5 DAYS  Final   Report Status 04/08/2016 FINAL  Final  MRSA  PCR Screening     Status: None   Collection Time: 04/03/16 10:21 PM  Result Value Ref Range Status   MRSA by PCR NEGATIVE NEGATIVE Final    Comment:        The GeneXpert MRSA Assay (FDA approved for NASAL specimens only), is one component of a comprehensive MRSA colonization surveillance program. It is not intended to diagnose MRSA infection nor to guide or monitor treatment for MRSA infections.   Urine culture     Status: Abnormal   Collection Time: 04/04/16 12:02 AM  Result Value Ref Range Status   Specimen Description URINE, RANDOM  Final   Special Requests Normal  Final   Culture MULTIPLE SPECIES PRESENT, SUGGEST RECOLLECTION (A)  Final   Report Status 04/05/2016 FINAL  Final     Scheduled Meds: . acidophilus  2 capsule Oral Daily  . antiseptic oral rinse  7 mL Mouth Rinse BID  . famotidine  20 mg Oral BID  . feeding supplement (ENSURE ENLIVE)  237 mL Oral TID BM  . gabapentin  900 mg Oral TID  . levothyroxine  200 mcg Oral QAC breakfast  . pantoprazole  40 mg Oral Daily  . predniSONE  20 mg Oral Q breakfast  . sertraline  100 mg Oral Daily  . sodium chloride flush  3 mL Intravenous Q12H  . vancomycin  250 mg Oral Q6H    Assessment/Plan:  1. Clinical sepsis Present on admission with C. difficile colitis, leukocytosis, fever and Hypotension. Continue oral vancomycin.  Holding antihypertensive medication. Acidophilus started. Will need oral vancomycin upon discharge. White blood cell count trending better. 2. Right shoulder pain. Xray done, gave one dose oral steroid.  Spoke with Dr Rudene Christians to evaluate 3. Vomiting dark material. Hemoglobin drifted lower. 4. Dysphagia diet, poor apetitie.  Palliative care  to follow at rehab 5. Hypothyroidism unspecified on levothyroxine 6. Depression on Zoloft 7. Syncope secondary to hypotension 8. Elevated troponin likely demand ischemia from hypotension 9. Weakness. Possible discharge to rehab tomorrow  Code Status:     Code Status Orders        Start     Ordered   04/03/16 2223  Do not attempt resuscitation (DNR)   Continuous    Question Answer Comment  In the event of cardiac or respiratory ARREST Do not call a "code blue"   In the event of cardiac or respiratory ARREST Do not perform Intubation, CPR, defibrillation or ACLS   In the event of cardiac or respiratory ARREST Use medication by any route, position, wound care, and other measures to relive pain and suffering. May use oxygen, suction and manual treatment of airway obstruction as needed for comfort.      04/03/16 2222    Code Status History    Date Active Date Inactive Code Status Order ID Comments User Context   This patient has a current code status but no historical code status.    Advance Directive Documentation        Most Recent Value   Type of Advance Directive  Living will   Pre-existing out of facility DNR order (yellow form or pink MOST form)     "MOST" Form in Place?       Disposition Plan: potentially out to rehabilitation tomorrow  Antibiotics:  Oral vancomycin  Time spent: 26 minutes  [place], [place]

## 2016-04-08 NOTE — Consult Note (Signed)
Please see full GI consult note by Ms Jacqulyn Liner. Patient seen and examined, chart reviewed.   Patient admitted on 6/9 with c/o diarrhea, hypotension.  Had been discharged from [facility] several days beefore that  For admission for sepsis, Multifocal PNA on CT.  Patient is a difficult historial, will not freely answer questions, critical of questions asked. He states he has some dark liquid emesis before his admission at [facility], however there was apparently no  GI evaluation at that time. He denies any melena or abdominal pain either before the current hospitalization or the previous one.  There has been no melena since admission or recurrent emesis.   Information from old charts available--Patient with complex history with apparent head and neck ca initially diagnosed and treated about 15 years ago, with possible recurrence , treated at [facility] about 5 years ago, minimal fu since that time.  He continues to eat a soft/semiliquid diet.  There is a history of recent meloxicam use for shoulder injury and chronic back pain related to post herpetic neuralgia for which he has been taking gabapentin, oxycodone and possibly the nsaid.   Seen at [facility] about a year ago to arrange a colonoscopy that could not be done due to poor prep and did not reschedule.  He declined egd at that time.   Currently being treated for positive C diff on this admission.  Stools seem to be slowly improving on currentl regimen of po vancomycin.  CXR still showing lung infiltrates.   Patient is currently hemodynamically stable, stable hgb since admission, without evidence of recurrent GI bleeding.    REC: 1) continue current C diff tx.  2) bid ppi 3) h pylori serology 4) no nsaids.  I have discussed this specifically and in detail with patient and his wife.  5) will hold on EGD at this time due to current clinical improvenent and stability and no evidence of ongoing bleeding in the setting of recent PNA.  He will need to have an  outpatient GI fu in 2-3 weeks.  6) consider arranging for either ENT consult or o/p followup re head and neck cancer history.   Will follow for now.

## 2016-04-08 NOTE — Consult Note (Signed)
82 year old male seen for evaluation of right shoulder pain. He is in a great deal of discomfort from the shoulder sequentially anterior shoulder and lateral shoulder at the deltoid insertion as well as over the anterior shoulder joint. He refused to allow for passive range of motion is external rotation actively is 40 internal rotation 50 with significant pain with resisted external rotation  X-rays reveal significant glenohumeral osteoarthritis with spurring of the before meals joint as well  Clinical impression is glenohumeral osteoarthritis right shoulder  Plan: Right shoulder intra-articular injection when medication available today

## 2016-04-08 NOTE — Care Management Important Message (Signed)
Important Message  Patient Details  Name: Samuel Ortiz MRN: [id number] Date of Birth: June 04, 1934   Medicare Important Message Given:  Yes    Shelbie Ammons, RN 04/08/2016, 8:26 AM

## 2016-04-09 LAB — H PYLORI, IGM, IGG, IGA AB
H Pylori IgG: <0.9 U/mL (ref 0.0–0.8)
H. Pylogi, Iga Abs: <9 units (ref 0.0–8.9)
H. Pylogi, Igm Abs: <9 units (ref 0.0–8.9)

## 2016-04-09 MED ORDER — HYDROCODONE-ACETAMINOPHEN 5-325 MG PO TABS: 1.0000 | ORAL_TABLET | Freq: Four times a day (QID) | ORAL | Status: AC | PRN

## 2016-04-09 MED ORDER — ENSURE ENLIVE PO LIQD: 237.0000 mL | Freq: Three times a day (TID) | ORAL | Status: AC

## 2016-04-09 MED ORDER — VANCOMYCIN 50 MG/ML ORAL SOLUTION: 250.0000 mg | Freq: Four times a day (QID) | ORAL | Status: DC

## 2016-04-09 MED ORDER — RISAQUAD PO CAPS
2.0000 | ORAL_CAPSULE | Freq: Every day | ORAL | Status: AC
Start: 2016-04-09 — End: ?

## 2016-04-09 MED ORDER — PANTOPRAZOLE SODIUM 40 MG PO TBEC: 40.0000 mg | DELAYED_RELEASE_TABLET | Freq: Two times a day (BID) | ORAL | Status: AC

## 2016-04-09 NOTE — Progress Notes (Signed)
Pt has been A&Ox4 with c/o right shoulder pain. XRay reveiled degenerative changes. Lung sounds have been clear/diminished on auscultation on RA. VS WNL. Pt has been pleasant and joking this am. However, pt's primary outpatient doctor called with concern of risk for suicide. Request for phychiatric consult. Per outpt doc- pt called wife asking her to pick up rat poison for himself. Per Dr Earleen Newport okay to transfer pt to facility with observation and psyche consult there. SW-Sarah informed and has arranged psyche to follow-up with pt once admitted. Orders to continue with transfer to facility.

## 2016-04-09 NOTE — Clinical Social Work Note (Signed)
Pt is ready for discharge today to [facility]. Facility has received discharge information and is ready to admit pt. Referral made for Palliative Care NP to follow. Pt's wife is aware and agreeable to discharge plan. RN to call report and EMS will provide transportation. CSW is signing off as no further needs identified.   Darden Dates, MSW, LCSW  Clinical Social Worker  [phone]

## 2016-04-09 NOTE — Progress Notes (Signed)
Referral for Facility Palliative consult at [facility] following discharge,  received from [place]. Patient information faxed to [facility] referral. Flo Shanks RN, BSN, [facility]

## 2016-04-09 NOTE — Discharge Summary (Addendum)
[facility] [place] NAME: Samuel Ortiz    MR#:  [id number]  DATE OF BIRTH:  02-06-1934  DATE OF ADMISSION:  04/03/2016 ADMITTING PHYSICIAN: Quintella Baton, MD  DATE OF DISCHARGE: 04/09/2016  PRIMARY CARE PHYSICIAN: Sherrin Daisy, MD    ADMISSION DIAGNOSIS:  Abnormal EKG [R94.31] Hypoxia [R09.02] Elevated troponin [R79.89] Near syncope [R55]  DISCHARGE DIAGNOSIS:  Principal Problem:   C. difficile diarrhea Active Problems:   Hypotension   GI bleed   Dysphagia   Syncope   Aspiration pneumonia (HCC)   Shingles   Post herpetic neuralgia   Elevated troponin   Hypothyroidism   Physical deconditioning   History of throat cancer   SECONDARY DIAGNOSIS:   Past Medical History  Diagnosis Date  . Cancer ([place])   . Hypothyroidism   . GERD (gastroesophageal reflux disease)   . Hypertension   . Arthritis   . Depression   . Shingles   . Vitamin B 12 deficiency   . Hypercholesterolemia   . Postherpetic neuralgia     HOSPITAL COURSE:   1. Clinical sepsis present on admission with C. difficile colitis, leukocytosis, fever and hypotension. Continue oral vancomycin for 14 day course (can use pill or liiquid form) Initially antihypertensive medication was stopped secondary to hypotension. Patient received numerous fluid boluses and IV fluids during the hospital course. White blood cell count trended better but still elevated. Acidophilus started. His risk for C. difficile colitis was recent antibiotics for pneumonia. 2. Right shoulder pain. X-ray showed degenerative changes. Dr. Rudene Christians orthopedic surgery injected his right shoulder with steroids. Will need physical therapy and occupational therapy to work with the shoulder follow-up with Dr. Rudene Christians in 2-3 weeks if no better. 3. Vomited or coughed up dark material upon admission. Hemoglobin remained relatively stable came down with hydration. Seen in consultation by GI and no procedures  ordered. Can follow-up with ENT as outpatient. 4. Dysphagia diet with poor appetite. Palliative care to follow at rehabilitation. Patient states he's been eating better. Patient on dysphagia 1 diet with thin liquids 5. Hypothyroidism unspecified on levothyroxin 6. Depression on Zoloft 7. Syncope secondary to hypotension 8. Elevated troponin demand ischemia from sepsis and hypotension 9. Weakness. Discharge to rehabilitation today 10. Patient is a DO NOT RESUSCITATE 82. PCP requested a psych consult after discharge order placed. They do have psychiatry at the facility too see the patient there.  Patient can go to facility today.  DISCHARGE CONDITIONS:   Satisfactory  CONSULTS OBTAINED:  Treatment Team:  Lollie Sails, MD Hessie Knows, MD  DRUG ALLERGIES:   Allergies  Allergen Reactions  . Morphine And Related Other (See Comments)    Reaction:  Hallucinations     DISCHARGE MEDICATIONS:   Current Discharge Medication List    START taking these medications   Details  acidophilus (RISAQUAD) CAPS capsule Take 2 capsules by mouth daily. Qty: 60 capsule, Refills: 0    feeding supplement, ENSURE ENLIVE, (ENSURE ENLIVE) LIQD Take 237 mLs by mouth 3 (three) times daily between meals. Qty: 90 Bottle, Refills: 0    pantoprazole (PROTONIX) 40 MG tablet Take 1 tablet (40 mg total) by mouth 2 (two) times daily. Qty: 60 tablet, Refills: 0    vancomycin (VANCOCIN) 50 mg/mL oral solution Take 5 mLs (250 mg total) by mouth every 6 (six) hours. 10 more days then stop Qty: 200 mL, Refills: 0      CONTINUE these medications which have CHANGED   Details  HYDROcodone-acetaminophen (NORCO/VICODIN) 5-325 MG tablet Take 1 tablet by mouth every 6 (six) hours as needed for moderate pain. Qty: 30 tablet, Refills: 0      CONTINUE these medications which have NOT CHANGED   Details  albuterol (PROVENTIL HFA;VENTOLIN HFA) 108 (90 Base) MCG/ACT inhaler Inhale 2 puffs into the lungs every 6  (six) hours as needed for wheezing or shortness of breath.    ferrous gluconate (FERGON) 324 MG tablet Take 324 mg by mouth daily with breakfast.    gabapentin (NEURONTIN) 300 MG capsule Take 900 mg by mouth 3 (three) times daily.     levothyroxine (SYNTHROID, LEVOTHROID) 200 MCG tablet Take 200 mcg by mouth daily before breakfast.    omega-3 acid ethyl esters (LOVAZA) 1 G capsule Take 1 g by mouth daily.    sertraline (ZOLOFT) 100 MG tablet Take 100 mg by mouth daily.    simvastatin (ZOCOR) 20 MG tablet Take 20 mg by mouth at bedtime.     vitamin B-12 (CYANOCOBALAMIN) 1000 MCG tablet Take 1,000 mcg by mouth daily.    carvedilol (COREG) 3.125 MG tablet Take 3.125 mg by mouth 2 (two) times daily with a meal. Reported on 04/07/2016    lisinopril (PRINIVIL,ZESTRIL) 20 MG tablet Take 20 mg by mouth daily. Reported on 04/07/2016      STOP taking these medications     meloxicam (MOBIC) 7.5 MG tablet          DISCHARGE INSTRUCTIONS:   Follow up with doctor at rehabilitation 1-2 days. Follow-up with physical therapy and occupational therapy at rehabilitation Follow-up with Dr. Rudene Christians orthopedic surgery in 2-3 weeks if no improvement in shoulder pain Can follow-up with ENT as outpatient with history of tongue cancer and head and neck cancer. Follow-up with palliative care at facility  If you experience worsening of your admission symptoms, develop shortness of breath, life threatening emergency, suicidal or homicidal thoughts you must seek medical attention immediately by calling 911 or calling your MD immediately  if symptoms less severe.  You Must read complete instructions/literature along with all the possible adverse reactions/side effects for all the Medicines you take and that have been prescribed to you. Take any new Medicines after you have completely understood and accept all the possible adverse reactions/side effects.   Please note  You were cared for by a hospitalist during  your hospital stay. If you have any questions about your discharge medications or the care you received while you were in the hospital after you are discharged, you can call the unit and asked to speak with the hospitalist on call if the hospitalist that took care of you is not available. Once you are discharged, your primary care physician will handle any further medical issues. Please note that NO REFILLS for any discharge medications will be authorized once you are discharged, as it is imperative that you return to your primary care physician (or establish a relationship with a primary care physician if you do not have one) for your aftercare needs so that they can reassess your need for medications and monitor your lab values.    Today   CHIEF COMPLAINT:   Chief Complaint  Patient presents with  . Fall    HISTORY OF PRESENT ILLNESS:  Samuel Ortiz  is a 82 y.o. male presented after a fall and found to be septic on presentation with C. difficile colitis   VITAL SIGNS:  Blood pressure 139/68, pulse 65, temperature 97.9 F (36.6 C), temperature source Oral, resp.  rate 18, height 6' (1.829 m), weight 103.2 kg (227 lb 8.2 oz), SpO2 95 %.    PHYSICAL EXAMINATION:  GENERAL:  82 y.o.-year-old patient lying in the bed with no acute distress.  EYES: Pupils equal, round, reactive to light and accommodation. No scleral icterus. Extraocular muscles intact.  HEENT: Head atraumatic, normocephalic. Oropharynx and nasopharynx clear.  NECK:  Supple, no jugular venous distention. No thyroid enlargement, no tenderness.  LUNGS: Normal breath sounds bilaterally, no wheezing, rales,rhonchi or crepitation. No use of accessory muscles of respiration.  CARDIOVASCULAR: S1, S2 normal. No murmurs, rubs, or gallops.  ABDOMEN: Soft, non-tender, non-distended. Bowel sounds present. No organomegaly or mass.  EXTREMITIES: Trace edema. Right shoulder pain with any movement. Limited range of motion in the right  shoulder NEUROLOGIC: Cranial nerves II through XII are intact. Muscle strength 5/5 in all extremities. Sensation intact. Gait not checked.  PSYCHIATRIC: The patient is alert and oriented x 3.  SKIN: No obvious rash, lesion, or ulcer.   DATA REVIEW:   CBC  Recent Labs Lab 04/08/16 0558  WBC 16.3*  HGB 10.8*  HCT 32.8*  PLT 377    Chemistries   Recent Labs Lab 04/03/16 1902  04/05/16 0420 04/06/16 0500  NA 136  < > 136 135  K 5.0  < > 4.4 3.9  CL 103  < > 111 110  CO2 24  < > 20* 20*  GLUCOSE 107*  < > 97 88  BUN 18  < > 19 13  CREATININE 1.19  < > 1.18 0.93  CALCIUM 7.8*  < > 7.3* 7.1*  MG  --   < > 2.4  --   AST 18  --   --   --   ALT 12*  --   --   --   ALKPHOS 16*  --   --   --   BILITOT 0.6  --   --   --   < > = values in this interval not displayed.  Cardiac Enzymes  Recent Labs Lab 04/04/16 1030  TROPONINI 0.06*    Microbiology Results  Results for orders placed or performed during the hospital encounter of 04/03/16  C difficile quick scan w PCR reflex     Status: Abnormal   Collection Time: 04/03/16  8:25 PM  Result Value Ref Range Status   C Diff antigen POSITIVE (A) NEGATIVE Final   C Diff toxin POSITIVE (A) NEGATIVE Final   C Diff interpretation   Final    Positive for toxigenic C. difficile, active toxin production present.    Comment: CRITICAL RESULT CALLED TO, READ BACK BY AND VERIFIED WITH: BUTCH WOODS AT 2128 04/03/16 MLZ    Gastrointestinal Panel by PCR , Stool     Status: None   Collection Time: 04/03/16  8:25 PM  Result Value Ref Range Status   Campylobacter species NOT DETECTED NOT DETECTED Final   Plesimonas shigelloides NOT DETECTED NOT DETECTED Final   Salmonella species NOT DETECTED NOT DETECTED Final   Yersinia enterocolitica NOT DETECTED NOT DETECTED Final   Vibrio species NOT DETECTED NOT DETECTED Final   Vibrio cholerae NOT DETECTED NOT DETECTED Final   Enteroaggregative E coli (EAEC) NOT DETECTED NOT DETECTED Final    Enteropathogenic E coli (EPEC) NOT DETECTED NOT DETECTED Final   Enterotoxigenic E coli (ETEC) NOT DETECTED NOT DETECTED Final   Shiga like toxin producing E coli (STEC) NOT DETECTED NOT DETECTED Final   E. coli O157 NOT DETECTED NOT DETECTED  Final   Shigella/Enteroinvasive E coli (EIEC) NOT DETECTED NOT DETECTED Final   Cryptosporidium NOT DETECTED NOT DETECTED Final   Cyclospora cayetanensis NOT DETECTED NOT DETECTED Final   Entamoeba histolytica NOT DETECTED NOT DETECTED Final   Giardia lamblia NOT DETECTED NOT DETECTED Final   Adenovirus F40/41 NOT DETECTED NOT DETECTED Final   Astrovirus NOT DETECTED NOT DETECTED Final   Norovirus GI/GII NOT DETECTED NOT DETECTED Final   Rotavirus A NOT DETECTED NOT DETECTED Final   Sapovirus (I, II, IV, and V) NOT DETECTED NOT DETECTED Final  Culture, blood (routine x 2)     Status: None   Collection Time: 04/03/16  8:34 PM  Result Value Ref Range Status   Specimen Description BLOOD LEFT ASSIST CONTROL  Final   Special Requests   Final    BOTTLES DRAWN AEROBIC AND ANAEROBIC 10CCAERO,20CCANA   Culture NO GROWTH 5 DAYS  Final   Report Status 04/08/2016 FINAL  Final  Culture, blood (routine x 2)     Status: None   Collection Time: 04/03/16  8:34 PM  Result Value Ref Range Status   Specimen Description BLOOD RIGHT ASSIST CONTROL  Final   Special Requests   Final    BOTTLES DRAWN AEROBIC AND ANAEROBIC 20CCANA,17CCANA   Culture NO GROWTH 5 DAYS  Final   Report Status 04/08/2016 FINAL  Final  MRSA PCR Screening     Status: None   Collection Time: 04/03/16 10:21 PM  Result Value Ref Range Status   MRSA by PCR NEGATIVE NEGATIVE Final    Comment:        The GeneXpert MRSA Assay (FDA approved for NASAL specimens only), is one component of a comprehensive MRSA colonization surveillance program. It is not intended to diagnose MRSA infection nor to guide or monitor treatment for MRSA infections.   Urine culture     Status: Abnormal   Collection  Time: 04/04/16 12:02 AM  Result Value Ref Range Status   Specimen Description URINE, RANDOM  Final   Special Requests Normal  Final   Culture MULTIPLE SPECIES PRESENT, SUGGEST RECOLLECTION (A)  Final   Report Status 04/05/2016 FINAL  Final    RADIOLOGY:  Dg Shoulder Right  04/08/2016  CLINICAL DATA:  Acute right shoulder pain without known injury. Initial encounter. EXAM: RIGHT SHOULDER - 2+ VIEW COMPARISON:  None. FINDINGS: There is no evidence of fracture or dislocation. Degenerative joint disease of the right acromioclavicular joint is noted. Mild osteophyte formation is seen involving the right glenohumeral joint. Soft tissues are unremarkable. IMPRESSION: Degenerative joint disease of the right acromioclavicular and glenohumeral joints is noted. No acute abnormality seen in the right shoulder. Electronically Signed   By: Marijo Conception, M.D.   On: 04/08/2016 09:24    Management plans discussed with the patient, and he is in agreement. Spoke with wife last evening about the plan  CODE STATUS:     Code Status Orders        Start     Ordered   04/03/16 2223  Do not attempt resuscitation (DNR)   Continuous    Question Answer Comment  In the event of cardiac or respiratory ARREST Do not call a "code blue"   In the event of cardiac or respiratory ARREST Do not perform Intubation, CPR, defibrillation or ACLS   In the event of cardiac or respiratory ARREST Use medication by any route, position, wound care, and other measures to relive pain and suffering. May use oxygen, suction and manual  treatment of airway obstruction as needed for comfort.      04/03/16 2222    Code Status History    Date Active Date Inactive Code Status Order ID Comments User Context   This patient has a current code status but no historical code status.    Advance Directive Documentation        Most Recent Value   Type of Advance Directive  Living will   Pre-existing out of facility DNR order (yellow form or  pink MOST form)     "MOST" Form in Place?        TOTAL TIME TAKING CARE OF THIS PATIENT: 35 minutes.    Loletha Grayer M.D on 04/09/2016 at 8:57 AM  Between 7am to 6pm - Pager - [phone]  After 6pm go to [email or web address] - password [email or web address]  [facility] Office  [phone]  CC: Primary care physician; Sherrin Daisy, MD

## 2016-04-09 NOTE — Clinical Social Work Placement (Signed)
   CLINICAL SOCIAL WORK PLACEMENT  NOTE  Date:  04/09/2016  Patient Details  Name: Samuel Ortiz MRN: [id number] Date of Birth: Oct 29, 1933  Clinical Social Work is seeking post-discharge placement for this patient at the [place] level of care (*CSW will initial, date and re-position this form in  chart as items are completed):  Yes   Patient/family provided with [facility] Work Department's list of facilities offering this level of care within the geographic area requested by the patient (or if unable, by the patient's family).  Yes   Patient/family informed of their freedom to choose among providers that offer the needed level of care, that participate in Medicare, Medicaid or managed care program needed by the patient, have an available bed and are willing to accept the patient.  Yes   Patient/family informed of [facility]'s ownership interest in [facility] and [facility], as well as of the fact that they are under no obligation to receive care at these facilities.  PASRR submitted to EDS on 04/05/16     PASRR number received on 04/05/16     Existing PASRR number confirmed on       FL2 transmitted to all facilities in geographic area requested by pt/family on 04/07/16     FL2 transmitted to all facilities within larger geographic area on       Patient informed that his/her managed care company has contracts with or will negotiate with certain facilities, including the following:        Yes   Patient/family informed of bed offers received.  Patient chooses bed at [facility]     Physician recommends and patient chooses bed at      Patient to be transferred to [facility] on 04/09/16.  Patient to be transferred to facility by [facility]     Patient family notified on 04/09/16 of transfer.  Name of family member notified:  [name], wife     PHYSICIAN       Additional Comment:     _______________________________________________ Darden Dates, LCSW 04/09/2016, 12:30 PM

## 2016-06-02 DIAGNOSIS — M25511 Pain in right shoulder: Secondary | ICD-10-CM | POA: Insufficient documentation

## 2016-06-02 DIAGNOSIS — M19011 Primary osteoarthritis, right shoulder: Secondary | ICD-10-CM | POA: Insufficient documentation

## 2016-07-21 ENCOUNTER — Ambulatory Visit: Payer: Self-pay | Admitting: Urology

## 2016-07-22 DIAGNOSIS — N179 Acute kidney failure, unspecified: Secondary | ICD-10-CM | POA: Insufficient documentation

## 2016-07-22 DIAGNOSIS — E860 Dehydration: Secondary | ICD-10-CM | POA: Insufficient documentation

## 2016-08-04 ENCOUNTER — Encounter: Payer: Self-pay | Admitting: Urology

## 2016-08-04 ENCOUNTER — Ambulatory Visit (INDEPENDENT_AMBULATORY_CARE_PROVIDER_SITE_OTHER): Payer: Medicare Other | Admitting: Urology

## 2016-08-04 VITALS — BP 120/71 | HR 57 | Ht 72.0 in | Wt 215.0 lb

## 2016-08-04 DIAGNOSIS — R3129 Other microscopic hematuria: Secondary | ICD-10-CM

## 2016-08-04 DIAGNOSIS — R310 Gross hematuria: Secondary | ICD-10-CM | POA: Diagnosis not present

## 2016-08-04 DIAGNOSIS — N138 Other obstructive and reflux uropathy: Secondary | ICD-10-CM | POA: Diagnosis not present

## 2016-08-04 DIAGNOSIS — N401 Enlarged prostate with lower urinary tract symptoms: Secondary | ICD-10-CM | POA: Diagnosis not present

## 2016-08-04 LAB — BLADDER SCAN AMB NON-IMAGING: SCAN RESULT: 35

## 2016-08-04 NOTE — Progress Notes (Signed)
08/04/2016 3:52 PM   Gwen Her Hartel 1933/12/30 [id number]  Referring provider: Sherrin Daisy, MD [facility], [place] [id number]  Chief Complaint  Patient presents with  . New Patient (Initial Visit)    Hematuria    HPI: Patient is a 82 -year-old Caucasian male who presents today as a referral from their PCP, Virk, for gross hematuria.  Patient was found to have microscopic hematuria on 06/30/2016 with > 50 RBC's/hpf.  This was associated with a positive urine culture for Proteus.  Patient doesn't have a prior history of hematuria.    Patient stated one month ago he had the sensation of a "hot poker up his penis."  His had a clear void and then it turned to blood.  He then went to his doctor and was given some pills and it cleared up.   He has not had any gross hematuria since that time.    He does not have a prior history of recurrent urinary tract infections, nephrolithiasis, trauma to the genitourinary tract, BPH or malignancies of the genitourinary tract.   He does not have a family medical history of nephrolithiasis, malignancies of the genitourinary tract or hematuria.   Today, he is having nocturia x 1, incontinence (post void dribbling) and hesitancy.  These are baseline symptoms.  His UA today does not demonstrate hematuria.    He is not experiencing any suprapubic pain, abdominal pain or flank pain. He denies any recent fevers, chills, nausea or vomiting.   He has not had any recent imaging studies.   He is a former smoker, with a 1 ppd history.  Quit in 1963.  He is not exposed to secondhand smoke.  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    PMH: Past Medical History:  Diagnosis Date  . Arthritis   . Cancer ([place])   . Depression   . GERD (gastroesophageal reflux disease)   . Hypercholesterolemia   . Hypertension   . Hypothyroidism   . Postherpetic neuralgia   . Shingles   . Vitamin B 12 deficiency     Surgical History: Past Surgical  History:  Procedure Laterality Date  . CORONARY ARTERY BYPASS GRAFT    . FRACTURE SURGERY    . orif femur fraction Right   . throat cancer     resection of cancer  . TONSILLECTOMY      Home Medications:    Medication List       Accurate as of 08/04/16  3:52 PM. Always use your most recent med list.          acidophilus Caps capsule Take 2 capsules by mouth daily.   albuterol 108 (90 Base) MCG/ACT inhaler Commonly known as:  PROVENTIL HFA;VENTOLIN HFA Inhale 2 puffs into the lungs every 6 (six) hours as needed for wheezing or shortness of breath.   carvedilol 3.125 MG tablet Commonly known as:  COREG Take 3.125 mg by mouth 2 (two) times daily with a meal. Reported on 04/07/2016   ergocalciferol 50000 units capsule Commonly known as:  VITAMIN D2 Take 50,000 Units by mouth once a week.   feeding supplement (ENSURE ENLIVE) Liqd Take 237 mLs by mouth 3 (three) times daily between meals.   ferrous gluconate 324 MG tablet Commonly known as:  FERGON Take 324 mg by mouth daily with breakfast.   gabapentin 300 MG capsule Commonly known as:  NEURONTIN Take 900 mg by mouth 3 (three) times daily.   HYDROcodone-acetaminophen 5-325 MG tablet Commonly  known as:  NORCO/VICODIN Take 1 tablet by mouth every 6 (six) hours as needed for moderate pain.   levothyroxine 200 MCG tablet Commonly known as:  SYNTHROID, LEVOTHROID Take 200 mcg by mouth daily before breakfast.   lisinopril 20 MG tablet Commonly known as:  PRINIVIL,ZESTRIL Take 20 mg by mouth daily. Reported on 04/07/2016   omega-3 acid ethyl esters 1 g capsule Commonly known as:  LOVAZA Take 1 g by mouth daily.   pantoprazole 40 MG tablet Commonly known as:  PROTONIX Take 1 tablet (40 mg total) by mouth 2 (two) times daily.   sertraline 100 MG tablet Commonly known as:  ZOLOFT Take 100 mg by mouth daily.   simvastatin 20 MG tablet Commonly known as:  ZOCOR Take 20 mg by mouth at bedtime.   vancomycin 50  mg/mL oral solution Commonly known as:  VANCOCIN Take 5 mLs (250 mg total) by mouth every 6 (six) hours. 10 more days then stop   vitamin B-12 1000 MCG tablet Commonly known as:  CYANOCOBALAMIN Take 1,000 mcg by mouth daily.       Allergies:  Allergies  Allergen Reactions  . Morphine And Related Other (See Comments)    Reaction:  Hallucinations     Family History: Family History  Problem Relation Age of Onset  . Prostate cancer Neg Hx   . Bladder Cancer Neg Hx   . Kidney cancer Neg Hx     Social History:  reports that he quit smoking about 55 years ago. He has never used smokeless tobacco. He reports that he does not drink alcohol or use drugs.  ROS: UROLOGY Frequent Urination?: No Hard to postpone urination?: No Burning/pain with urination?: No Get up at night to urinate?: Yes Leakage of urine?: Yes Urine stream starts and stops?: No Trouble starting stream?: Yes Do you have to strain to urinate?: No Blood in urine?: No Urinary tract infection?: No Sexually transmitted disease?: No Injury to kidneys or bladder?: No Painful intercourse?: No Weak stream?: No Erection problems?: No Penile pain?: No  Gastrointestinal Nausea?: No Vomiting?: No Indigestion/heartburn?: Yes Diarrhea?: Yes Constipation?: Yes  Constitutional Fever: Yes Night sweats?: No Weight loss?: No Fatigue?: Yes  Skin Skin rash/lesions?: No Itching?: No  Eyes Blurred vision?: No Double vision?: No  Ears/Nose/Throat Sore throat?: No Sinus problems?: No  Hematologic/Lymphatic Swollen glands?: No Easy bruising?: Yes  Cardiovascular Leg swelling?: No Chest pain?: No  Respiratory Cough?: No Shortness of breath?: Yes  Endocrine Excessive thirst?: No  Musculoskeletal Back pain?: Yes Joint pain?: Yes  Neurological Headaches?: No Dizziness?: No  Psychologic Depression?: Yes Anxiety?: Yes  Physical Exam: BP 120/71 (BP Location: Right Arm, Patient Position: Sitting,  Cuff Size: Normal)   Pulse (!) 57   Ht 6' (1.829 m)   Wt 215 lb (97.5 kg)   BMI 29.16 kg/m   Constitutional: Well nourished. Alert and oriented, No acute distress. HEENT: [place] AT, moist mucus membranes. Trachea midline, no masses. Cardiovascular: No clubbing, cyanosis, or edema. Respiratory: Normal respiratory effort, no increased work of breathing. GI: Abdomen is soft, non tender, non distended, no abdominal masses. Liver and spleen not palpable.  No hernias appreciated.  Stool sample for occult testing is not indicated.   GU: No CVA tenderness.  No bladder fullness or masses.  Patient with circumcised phallus.  Urethral meatus is patent.  No penile discharge. No penile lesions or rashes. Scrotum without lesions, cysts, rashes and/or edema.  Testicles are located scrotally bilaterally. No masses are appreciated in  the testicles. Left and right epididymis are normal. Rectal: Patient with  normal sphincter tone. Anus and perineum without scarring or rashes. No rectal masses are appreciated. Prostate is approximately 55 grams, no nodules are appreciated. Seminal vesicles are normal. Skin: No rashes, bruises or suspicious lesions. Lymph: No cervical or inguinal adenopathy. Neurologic: Grossly intact, no focal deficits, moving all 4 extremities. Psychiatric: Normal mood and affect.  Laboratory Data: Lab Results  Component Value Date   WBC 16.3 (H) 04/08/2016   HGB 10.8 (L) 04/08/2016   HCT 32.8 (L) 04/08/2016   MCV 84.0 04/08/2016   PLT 377 04/08/2016    Lab Results  Component Value Date   CREATININE 0.93 04/06/2016     Lab Results  Component Value Date   TSH 1.853 04/04/2016    Lab Results  Component Value Date   AST 18 04/03/2016   Lab Results  Component Value Date   ALT 12 (L) 04/03/2016     Urinalysis Unremarkable.  See EPIC.   Assessment & Plan:    1. Gross hematuria  - Patient with an episode of gross hematuria associated with an UTI  - Hematuria has not  found to persist after infection has resolved - would not recommend a hematuria work up at this time unless hematuria occurs in the face of a negative culture  - Urinalysis, Complete -   - CULTURE, URINE COMPREHENSIVE  - BUN+Creat  2. BPH with LUTS  - Baseline urinary symptoms are not bothersome to him at this time  - Recommend yearly exams and IPSS  Return for UA only.  These notes generated with voice recognition software. I apologize for typographical errors.  Zara Council, [facility] [address] [place], [place] [address] [phone]

## 2016-08-05 ENCOUNTER — Other Ambulatory Visit: Payer: Self-pay | Admitting: Surgery

## 2016-08-05 ENCOUNTER — Other Ambulatory Visit: Payer: Medicare Other

## 2016-08-05 DIAGNOSIS — R310 Gross hematuria: Secondary | ICD-10-CM

## 2016-08-05 DIAGNOSIS — M19011 Primary osteoarthritis, right shoulder: Secondary | ICD-10-CM

## 2016-08-05 LAB — URINALYSIS, COMPLETE
BILIRUBIN UA: NEGATIVE
GLUCOSE, UA: NEGATIVE
KETONES UA: NEGATIVE
LEUKOCYTES UA: NEGATIVE
Nitrite, UA: NEGATIVE
PROTEIN UA: NEGATIVE
RBC UA: NEGATIVE
SPEC GRAV UA: 1.025 (ref 1.005–1.030)
Urobilinogen, Ur: 0.2 mg/dL (ref 0.2–1.0)
pH, UA: 5.5 (ref 5.0–7.5)

## 2016-08-05 LAB — BUN+CREAT
BUN/Creatinine Ratio: 20 (ref 10–24)
BUN: 23 mg/dL (ref 8–27)
Creatinine, Ser: 1.16 mg/dL (ref 0.76–1.27)
GFR calc Af Amer: 68 mL/min/{1.73_m2} (ref >59)
GFR calc non Af Amer: 59 mL/min/{1.73_m2} — ABNORMAL LOW (ref >59)

## 2016-08-05 LAB — MICROSCOPIC EXAMINATION
Bacteria, UA: NONE SEEN
RBC, UA: NONE SEEN /hpf (ref 0–?)

## 2016-08-08 LAB — CULTURE, URINE COMPREHENSIVE

## 2016-08-10 ENCOUNTER — Telehealth: Payer: Self-pay

## 2016-08-10 NOTE — Telephone Encounter (Signed)
No answer

## 2016-08-10 NOTE — Telephone Encounter (Signed)
-----   Message from Nori Riis, PA-C sent at 08/09/2016  2:58 PM EDT ----- Patient's urine culture was negative.  Due to his age, he may choose not to undergo a workup at this time until another episode of hematuria occurs.

## 2016-08-12 NOTE — Telephone Encounter (Signed)
Mailbox full

## 2016-08-13 NOTE — Telephone Encounter (Signed)
No answer. Will send a letter.  

## 2016-08-14 ENCOUNTER — Ambulatory Visit
Admission: RE | Admit: 2016-08-14 | Discharge: 2016-08-14 | Disposition: A | Discharge status: Home / Self Care | Payer: Medicare Other | Source: Ambulatory Visit | Attending: Surgery | Admitting: Surgery

## 2016-08-14 DIAGNOSIS — M19011 Primary osteoarthritis, right shoulder: Secondary | ICD-10-CM | POA: Diagnosis present

## 2016-08-14 DIAGNOSIS — M659 Synovitis and tenosynovitis, unspecified: Secondary | ICD-10-CM | POA: Diagnosis not present

## 2016-08-19 ENCOUNTER — Telehealth: Payer: Self-pay | Admitting: Urology

## 2016-08-19 NOTE — Telephone Encounter (Signed)
Patient received the letter regarding his negative urine culture.    He will contact our office if he notices blood in his urine.

## 2016-09-02 ENCOUNTER — Encounter
Admission: RE | Admit: 2016-09-02 | Discharge: 2016-09-02 | Disposition: A | Discharge status: Home / Self Care | Payer: Medicare Other | Source: Ambulatory Visit | Attending: Surgery | Admitting: Surgery

## 2016-09-02 DIAGNOSIS — Z01812 Encounter for preprocedural laboratory examination: Secondary | ICD-10-CM | POA: Diagnosis present

## 2016-09-02 DIAGNOSIS — M19011 Primary osteoarthritis, right shoulder: Secondary | ICD-10-CM | POA: Insufficient documentation

## 2016-09-02 HISTORY — DX: Other bacterial infections of unspecified site: A49.8

## 2016-09-02 LAB — BASIC METABOLIC PANEL
Anion gap: 6 (ref 5–15)
BUN: 27 mg/dL — AB (ref 6–20)
CHLORIDE: 105 mmol/L (ref 101–111)
CO2: 28 mmol/L (ref 22–32)
Calcium: 8.9 mg/dL (ref 8.9–10.3)
Creatinine, Ser: 1.26 mg/dL — ABNORMAL HIGH (ref 0.61–1.24)
GFR calc Af Amer: >60 mL/min (ref >60)
GFR calc non Af Amer: 52 mL/min — ABNORMAL LOW (ref >60)
Glucose, Bld: 91 mg/dL (ref 65–99)
POTASSIUM: 5.1 mmol/L (ref 3.5–5.1)
SODIUM: 139 mmol/L (ref 135–145)

## 2016-09-02 LAB — CBC
HCT: 41 % (ref 40.0–52.0)
HEMOGLOBIN: 13.1 g/dL (ref 13.0–18.0)
MCH: 26.6 pg (ref 26.0–34.0)
MCHC: 31.8 g/dL — ABNORMAL LOW (ref 32.0–36.0)
MCV: 83.7 fL (ref 80.0–100.0)
Platelets: 277 10*3/uL (ref 150–440)
RBC: 4.9 MIL/uL (ref 4.40–5.90)
RDW: 16.9 % — ABNORMAL HIGH (ref 11.5–14.5)
WBC: 12.6 10*3/uL — ABNORMAL HIGH (ref 3.8–10.6)

## 2016-09-02 LAB — TYPE AND SCREEN
ABO/RH(D): B POS
Antibody Screen: NEGATIVE

## 2016-09-02 LAB — SEDIMENTATION RATE: SED RATE: 37 mm/h — AB (ref 0–20)

## 2016-09-02 LAB — PROTIME-INR
INR: 1.02
PROTHROMBIN TIME: 13.4 s (ref 11.4–15.2)

## 2016-09-02 LAB — APTT: APTT: 48 s — AB (ref 24–36)

## 2016-09-02 NOTE — Patient Instructions (Signed)
Your procedure is scheduled on: Tuesday 09/08/16 Report to Day Surgery. 2ND FLOOR MEDICAL MALL ENTRANCE To find out your arrival time please call [phone] between 1PM - 3PM on Monday 09/07/16.  Remember: Instructions that are not followed completely may result in serious medical risk, up to and including death, or upon the discretion of your surgeon and anesthesiologist your surgery may need to be rescheduled.    __X__ 1. Do not eat food or drink liquids after midnight. No gum chewing or hard candies.     __X__ 2. No Alcohol for 24 hours before or after surgery.   ____ 3. Bring all medications with you on the day of surgery if instructed.    __X__ 4. Notify your doctor if there is any change in your medical condition     (cold, fever, infections).     Do not wear jewelry, make-up, hairpins, clips or nail polish.  Do not wear lotions, powders, or perfumes.   Do not shave 48 hours prior to surgery. Men may shave face and neck.  Do not bring valuables to the hospital.    [facility] is not responsible for any belongings or valuables.               Contacts, dentures or bridgework may not be worn into surgery.  Leave your suitcase in the car. After surgery it may be brought to your room.  For patients admitted to the hospital, discharge time is determined by your                treatment team.   Patients discharged the day of surgery will not be allowed to drive home.   Please read over the following fact sheets that you were given:   Pain Booklet and MRSA Information   __X__ Take these medicines the morning of surgery with A SIP OF WATER:    1. SERTRALINE  2. OMPRAZOLE  3. LISINOPRIL  4. LEVOTHYROXINE  5. GABAPENTIN  6. CARVEDILOL MAY TAKE HYDROCODONE IF NEEDED  ____ Fleet Enema (as directed)   __X__ Use CHG Soap as directed  ___X_ Use inhalers on the day of surgery  ____ Stop metformin 2 days prior to surgery    ____ Take 1/2 of usual insulin dose the night before  surgery and none on the morning of surgery.   __X__ Stop Coumadin/Plavix/aspirin on TODAY  ___X_ Stop Anti-inflammatories on (MELOXICAM) TODAY   ____ Stop supplements until after surgery.    ____ Bring C-Pap to the hospital.

## 2016-09-02 NOTE — Pre-Procedure Instructions (Addendum)
   ECG 12 lead (Adult)05/27/2016 [facility] Health Care Component Name Value Ref Range  EKG Ventricular Rate 68 BPM   EKG Atrial Rate 68 BPM   EKG P-R Interval 260 ms  EKG QRS Duration 104 ms  EKG Q-T Interval 448 ms  EKG QTC Calculation 476 ms  EKG Calculated P Axis 61 degrees   EKG Calculated R Axis 19 degrees   EKG Calculated T Axis 14 degrees   Result Narrative  SINUS RHYTHM WITH 1ST DEGREE AV BLOCK INFERIOR INFARCT , AGE UNDETERMINED T WAVE ABNORMALITY, CONSIDER ANTERIOR ISCHEMIA ABNORMAL ECG NO PREVIOUS ECGS AVAILABLE Confirmed by Charletta Cousin, CODY ([id number]) on 05/29/2016 3:16:11 PM  Status     ECG 12 Lead5/28/2017 [facility] Health Care Component Name Value Ref Range  EKG Ventricular Rate 65 BPM   EKG Atrial Rate 65 BPM   EKG P-R Interval 280 ms  EKG QRS Duration 108 ms  EKG Q-T Interval 428 ms  EKG QTC Calculation 445 ms  EKG Calculated P Axis 24 degrees   EKG Calculated R Axis -4 degrees   EKG Calculated T Axis -7 degrees   Result Narrative  SINUS RHYTHM WITH 1ST DEGREE AV BLOCK WITH PREMATURE SUPRAVENTRICULAR BEATS INFERIOR INFARCT , AGE UNDETERMINED ABNORMAL ECG  Confirmed by Lenord Fellers [phone]) on 03/23/2016 8:52:37 AM

## 2016-09-02 NOTE — Pre-Procedure Instructions (Signed)
Medical history and EKG reviewed with Dr Marcello Moores, no new orders.

## 2016-09-08 ENCOUNTER — Encounter
Admission: RE | Disposition: A | Discharge status: Home Health | Payer: Self-pay | Source: Ambulatory Visit | Attending: Surgery

## 2016-09-08 ENCOUNTER — Inpatient Hospital Stay: Payer: Medicare Other

## 2016-09-08 ENCOUNTER — Inpatient Hospital Stay
Admission: RE | Admit: 2016-09-08 | Discharge: 2016-09-09 | DRG: 483 | Disposition: A | Discharge status: Home Health | Payer: Medicare Other | Source: Ambulatory Visit | Attending: Surgery | Admitting: Surgery

## 2016-09-08 ENCOUNTER — Inpatient Hospital Stay: Payer: Medicare Other | Admitting: Anesthesiology

## 2016-09-08 DIAGNOSIS — M25511 Pain in right shoulder: Secondary | ICD-10-CM | POA: Diagnosis present

## 2016-09-08 DIAGNOSIS — E7800 Pure hypercholesterolemia, unspecified: Secondary | ICD-10-CM | POA: Diagnosis present

## 2016-09-08 DIAGNOSIS — K219 Gastro-esophageal reflux disease without esophagitis: Secondary | ICD-10-CM | POA: Diagnosis present

## 2016-09-08 DIAGNOSIS — E039 Hypothyroidism, unspecified: Secondary | ICD-10-CM | POA: Diagnosis present

## 2016-09-08 DIAGNOSIS — F329 Major depressive disorder, single episode, unspecified: Secondary | ICD-10-CM | POA: Diagnosis present

## 2016-09-08 DIAGNOSIS — Z96611 Presence of right artificial shoulder joint: Secondary | ICD-10-CM

## 2016-09-08 DIAGNOSIS — I10 Essential (primary) hypertension: Secondary | ICD-10-CM | POA: Diagnosis present

## 2016-09-08 DIAGNOSIS — R262 Difficulty in walking, not elsewhere classified: Secondary | ICD-10-CM

## 2016-09-08 DIAGNOSIS — M19011 Primary osteoarthritis, right shoulder: Principal | ICD-10-CM | POA: Diagnosis present

## 2016-09-08 HISTORY — PX: TOTAL SHOULDER ARTHROPLASTY: SHX126

## 2016-09-08 LAB — ABO/RH: ABO/RH(D): B POS

## 2016-09-08 LAB — SURGICAL PCR SCREEN
MRSA, PCR: NEGATIVE
Staphylococcus aureus: POSITIVE — AB

## 2016-09-08 SURGERY — ARTHROPLASTY, SHOULDER, TOTAL
Anesthesia: General | Site: Shoulder | Laterality: Right | Wound class: Clean

## 2016-09-08 MED ORDER — KCL IN DEXTROSE-NACL 20-5-0.9 MEQ/L-%-% IV SOLN
INTRAVENOUS | Status: DC
  Administered 2016-09-08 – 2016-09-09 (×2): via INTRAVENOUS
  Filled 2016-09-08 (×4): qty 1000

## 2016-09-08 MED ORDER — SIMVASTATIN 20 MG PO TABS
20.0000 mg | ORAL_TABLET | Freq: Every day | ORAL | Status: DC
  Administered 2016-09-08: 20 mg via ORAL
  Filled 2016-09-08: qty 1

## 2016-09-08 MED ORDER — ROPIVACAINE HCL 5 MG/ML IJ SOLN
INTRAMUSCULAR | Status: AC
  Filled 2016-09-08: qty 40

## 2016-09-08 MED ORDER — SUGAMMADEX SODIUM 200 MG/2ML IV SOLN
INTRAVENOUS | Status: DC | PRN
  Administered 2016-09-08: 190.6 mg via INTRAVENOUS

## 2016-09-08 MED ORDER — ONDANSETRON HCL 4 MG PO TABS: 4.0000 mg | ORAL_TABLET | Freq: Four times a day (QID) | ORAL | Status: DC | PRN

## 2016-09-08 MED ORDER — OXYCODONE HCL 5 MG PO TABS: 5.0000 mg | ORAL_TABLET | Freq: Once | ORAL | Status: DC | PRN

## 2016-09-08 MED ORDER — ACETAMINOPHEN 650 MG RE SUPP: 650.0000 mg | Freq: Four times a day (QID) | RECTAL | Status: DC | PRN

## 2016-09-08 MED ORDER — ASPIRIN EC 81 MG PO TBEC
81.0000 mg | DELAYED_RELEASE_TABLET | Freq: Every day | ORAL | Status: DC
  Administered 2016-09-09: 81 mg via ORAL
  Filled 2016-09-08: qty 1

## 2016-09-08 MED ORDER — BUPIVACAINE LIPOSOME 1.3 % IJ SUSP
INTRAMUSCULAR | Status: AC
  Filled 2016-09-08: qty 20

## 2016-09-08 MED ORDER — GLYCOPYRROLATE 0.2 MG/ML IJ SOLN
INTRAMUSCULAR | Status: DC | PRN
  Administered 2016-09-08: 0.2 mg via INTRAVENOUS

## 2016-09-08 MED ORDER — RISAQUAD PO CAPS
2.0000 | ORAL_CAPSULE | Freq: Every day | ORAL | Status: DC
  Administered 2016-09-09: 2 via ORAL
  Filled 2016-09-08: qty 2

## 2016-09-08 MED ORDER — PHENYLEPHRINE HCL 10 MG/ML IJ SOLN
INTRAMUSCULAR | Status: AC
  Filled 2016-09-08: qty 1

## 2016-09-08 MED ORDER — ROCURONIUM BROMIDE 100 MG/10ML IV SOLN
INTRAVENOUS | Status: DC | PRN
  Administered 2016-09-08: 10 mg via INTRAVENOUS
  Administered 2016-09-08: 40 mg via INTRAVENOUS

## 2016-09-08 MED ORDER — FAMOTIDINE 20 MG PO TABS
ORAL_TABLET | ORAL | Status: AC
  Administered 2016-09-08: 20 mg
  Filled 2016-09-08: qty 1

## 2016-09-08 MED ORDER — CEFAZOLIN SODIUM-DEXTROSE 2-4 GM/100ML-% IV SOLN
2.0000 g | Freq: Four times a day (QID) | INTRAVENOUS | Status: AC
  Administered 2016-09-08 – 2016-09-09 (×3): 2 g via INTRAVENOUS
  Filled 2016-09-08 (×3): qty 100

## 2016-09-08 MED ORDER — MIDAZOLAM HCL 5 MG/5ML IJ SOLN
INTRAMUSCULAR | Status: AC
  Filled 2016-09-08: qty 5

## 2016-09-08 MED ORDER — ACETAMINOPHEN 500 MG PO TABS
1000.0000 mg | ORAL_TABLET | Freq: Four times a day (QID) | ORAL | Status: DC
  Administered 2016-09-08 – 2016-09-09 (×3): 1000 mg via ORAL
  Filled 2016-09-08 (×3): qty 2

## 2016-09-08 MED ORDER — ALBUTEROL SULFATE (2.5 MG/3ML) 0.083% IN NEBU: 2.5000 mg | INHALATION_SOLUTION | Freq: Four times a day (QID) | RESPIRATORY_TRACT | Status: DC | PRN

## 2016-09-08 MED ORDER — BISACODYL 10 MG RE SUPP: 10.0000 mg | Freq: Every day | RECTAL | Status: DC | PRN

## 2016-09-08 MED ORDER — OXYCODONE HCL 5 MG PO TABS
5.0000 mg | ORAL_TABLET | ORAL | Status: DC | PRN
  Administered 2016-09-08: 10 mg via ORAL
  Administered 2016-09-08: 5 mg via ORAL
  Administered 2016-09-09 (×2): 10 mg via ORAL
  Filled 2016-09-08: qty 1
  Filled 2016-09-08: qty 2
  Filled 2016-09-08: qty 1
  Filled 2016-09-08: qty 2
  Filled 2016-09-08: qty 1

## 2016-09-08 MED ORDER — LEVOTHYROXINE SODIUM 100 MCG PO TABS
200.0000 ug | ORAL_TABLET | Freq: Every day | ORAL | Status: DC
  Filled 2016-09-08: qty 2

## 2016-09-08 MED ORDER — ENOXAPARIN SODIUM 40 MG/0.4ML ~~LOC~~ SOLN
40.0000 mg | SUBCUTANEOUS | Status: DC
  Administered 2016-09-09: 40 mg via SUBCUTANEOUS
  Filled 2016-09-08: qty 0.4

## 2016-09-08 MED ORDER — PHENYLEPHRINE HCL 10 MG/ML IJ SOLN
INTRAMUSCULAR | Status: DC | PRN
  Administered 2016-09-08: 100 mL via TOPICAL

## 2016-09-08 MED ORDER — PROMETHAZINE HCL 25 MG/ML IJ SOLN: 6.2500 mg | INTRAMUSCULAR | Status: DC | PRN

## 2016-09-08 MED ORDER — LEVOTHYROXINE SODIUM 50 MCG PO TABS
25.0000 ug | ORAL_TABLET | Freq: Every day | ORAL | Status: DC
  Filled 2016-09-08: qty 1

## 2016-09-08 MED ORDER — BUPIVACAINE-EPINEPHRINE (PF) 0.5% -1:200000 IJ SOLN
INTRAMUSCULAR | Status: DC | PRN
  Administered 2016-09-08: 30 mL via PERINEURAL

## 2016-09-08 MED ORDER — DIPHENHYDRAMINE HCL 12.5 MG/5ML PO ELIX: 12.5000 mg | ORAL_SOLUTION | ORAL | Status: DC | PRN

## 2016-09-08 MED ORDER — SERTRALINE HCL 100 MG PO TABS
100.0000 mg | ORAL_TABLET | Freq: Every day | ORAL | Status: DC
  Administered 2016-09-09: 100 mg via ORAL
  Filled 2016-09-08: qty 1

## 2016-09-08 MED ORDER — CEFAZOLIN SODIUM-DEXTROSE 2-4 GM/100ML-% IV SOLN
INTRAVENOUS | Status: AC
  Filled 2016-09-08: qty 100

## 2016-09-08 MED ORDER — LIDOCAINE HCL (PF) 1 % IJ SOLN
INTRAMUSCULAR | Status: AC
  Filled 2016-09-08: qty 5

## 2016-09-08 MED ORDER — BUPIVACAINE LIPOSOME 1.3 % IJ SUSP
INTRAMUSCULAR | Status: DC | PRN
  Administered 2016-09-08: 20 mL

## 2016-09-08 MED ORDER — LISINOPRIL 10 MG PO TABS
10.0000 mg | ORAL_TABLET | Freq: Every day | ORAL | Status: DC
  Administered 2016-09-09: 10 mg via ORAL
  Filled 2016-09-08: qty 1

## 2016-09-08 MED ORDER — OXYCODONE HCL 5 MG/5ML PO SOLN: 5.0000 mg | Freq: Once | ORAL | Status: DC | PRN

## 2016-09-08 MED ORDER — PHENYLEPHRINE HCL 10 MG/ML IJ SOLN: INTRAVENOUS | Status: DC | PRN

## 2016-09-08 MED ORDER — GABAPENTIN 300 MG PO CAPS
900.0000 mg | ORAL_CAPSULE | Freq: Three times a day (TID) | ORAL | Status: DC
  Administered 2016-09-08 – 2016-09-09 (×3): 900 mg via ORAL
  Filled 2016-09-08 (×3): qty 3

## 2016-09-08 MED ORDER — FLEET ENEMA 7-19 GM/118ML RE ENEM: 1.0000 | ENEMA | Freq: Once | RECTAL | Status: DC | PRN

## 2016-09-08 MED ORDER — SODIUM CHLORIDE 0.9 % IV SOLN
2000.0000 mg | INTRAVENOUS | Status: AC
  Administered 2016-09-08: 2000 mg via INTRAVENOUS
  Filled 2016-09-08: qty 20

## 2016-09-08 MED ORDER — CEFAZOLIN IN D5W 1 GM/50ML IV SOLN
INTRAVENOUS | Status: DC | PRN
  Administered 2016-09-08: 2 g via INTRAVENOUS

## 2016-09-08 MED ORDER — FENTANYL CITRATE (PF) 100 MCG/2ML IJ SOLN: 25.0000 ug | INTRAMUSCULAR | Status: DC | PRN

## 2016-09-08 MED ORDER — BUPIVACAINE HCL (PF) 0.5 % IJ SOLN
INTRAMUSCULAR | Status: AC
  Filled 2016-09-08: qty 30

## 2016-09-08 MED ORDER — BUPIVACAINE-EPINEPHRINE (PF) 0.5% -1:200000 IJ SOLN
INTRAMUSCULAR | Status: AC
  Filled 2016-09-08: qty 60

## 2016-09-08 MED ORDER — SERTRALINE HCL 50 MG PO TABS
25.0000 mg | ORAL_TABLET | Freq: Every day | ORAL | Status: DC
  Administered 2016-09-09: 25 mg via ORAL
  Filled 2016-09-08: qty 1

## 2016-09-08 MED ORDER — SODIUM CHLORIDE 0.9 % IJ SOLN
INTRAMUSCULAR | Status: AC
  Filled 2016-09-08: qty 50

## 2016-09-08 MED ORDER — ROPIVACAINE HCL 5 MG/ML IJ SOLN
INTRAMUSCULAR | Status: DC | PRN
  Administered 2016-09-08: 30 mL via PERINEURAL

## 2016-09-08 MED ORDER — TRANEXAMIC ACID 1000 MG/10ML IV SOLN
INTRAVENOUS | Status: DC | PRN
  Administered 2016-09-08: 1000 mg via INTRAVENOUS

## 2016-09-08 MED ORDER — ONDANSETRON HCL 4 MG/2ML IJ SOLN
INTRAMUSCULAR | Status: DC | PRN
Start: 2016-09-08 — End: 2016-09-08
  Administered 2016-09-08: 4 mg via INTRAVENOUS

## 2016-09-08 MED ORDER — NEOMYCIN-POLYMYXIN B GU 40-200000 IR SOLN
Status: DC | PRN
  Administered 2016-09-08: 2 mL

## 2016-09-08 MED ORDER — NEOMYCIN-POLYMYXIN B GU 40-200000 IR SOLN
Status: AC
  Filled 2016-09-08: qty 20

## 2016-09-08 MED ORDER — EPHEDRINE SULFATE 50 MG/ML IJ SOLN
INTRAMUSCULAR | Status: DC | PRN
  Administered 2016-09-08: 15 mg via INTRAVENOUS
  Administered 2016-09-08: 10 mg via INTRAVENOUS

## 2016-09-08 MED ORDER — PANTOPRAZOLE SODIUM 40 MG PO TBEC
40.0000 mg | DELAYED_RELEASE_TABLET | Freq: Two times a day (BID) | ORAL | Status: DC
  Administered 2016-09-08 – 2016-09-09 (×2): 40 mg via ORAL
  Filled 2016-09-08 (×2): qty 1

## 2016-09-08 MED ORDER — FENTANYL CITRATE (PF) 100 MCG/2ML IJ SOLN
INTRAMUSCULAR | Status: DC | PRN
  Administered 2016-09-08: 50 ug via INTRAVENOUS

## 2016-09-08 MED ORDER — LACTATED RINGERS IV SOLN
INTRAVENOUS | Status: DC
  Administered 2016-09-08 (×3): via INTRAVENOUS

## 2016-09-08 MED ORDER — ONDANSETRON HCL 4 MG/2ML IJ SOLN: 4.0000 mg | Freq: Four times a day (QID) | INTRAMUSCULAR | Status: DC | PRN

## 2016-09-08 MED ORDER — MAGNESIUM HYDROXIDE 400 MG/5ML PO SUSP: 30.0000 mL | Freq: Every day | ORAL | Status: DC | PRN

## 2016-09-08 MED ORDER — DOCUSATE SODIUM 100 MG PO CAPS
100.0000 mg | ORAL_CAPSULE | Freq: Every day | ORAL | Status: DC
  Administered 2016-09-09: 100 mg via ORAL
  Filled 2016-09-08: qty 1

## 2016-09-08 MED ORDER — MEPERIDINE HCL 25 MG/ML IJ SOLN: 6.2500 mg | INTRAMUSCULAR | Status: DC | PRN

## 2016-09-08 MED ORDER — CARVEDILOL 3.125 MG PO TABS
3.1250 mg | ORAL_TABLET | Freq: Two times a day (BID) | ORAL | Status: DC
  Administered 2016-09-08 – 2016-09-09 (×2): 3.125 mg via ORAL
  Filled 2016-09-08 (×2): qty 1

## 2016-09-08 MED ORDER — METOCLOPRAMIDE HCL 10 MG PO TABS: 5.0000 mg | ORAL_TABLET | Freq: Three times a day (TID) | ORAL | Status: DC | PRN

## 2016-09-08 MED ORDER — MIDAZOLAM HCL 2 MG/2ML IJ SOLN
1.0000 mg | Freq: Once | INTRAMUSCULAR | Status: AC
  Administered 2016-09-08: 1 mg via INTRAVENOUS

## 2016-09-08 MED ORDER — ENSURE ENLIVE PO LIQD
237.0000 mL | Freq: Three times a day (TID) | ORAL | Status: DC
  Administered 2016-09-08 – 2016-09-09 (×2): 237 mL via ORAL

## 2016-09-08 MED ORDER — PROPOFOL 10 MG/ML IV BOLUS
INTRAVENOUS | Status: DC | PRN
  Administered 2016-09-08: 50 mg via INTRAVENOUS

## 2016-09-08 MED ORDER — METOCLOPRAMIDE HCL 5 MG/ML IJ SOLN: 5.0000 mg | Freq: Three times a day (TID) | INTRAMUSCULAR | Status: DC | PRN

## 2016-09-08 MED ORDER — SODIUM CHLORIDE 0.9 % IV SOLN
INTRAVENOUS | Status: DC | PRN
  Administered 2016-09-08 (×2): 30 ug/min via INTRAVENOUS

## 2016-09-08 MED ORDER — CEFAZOLIN SODIUM-DEXTROSE 2-4 GM/100ML-% IV SOLN: 2.0000 g | Freq: Once | INTRAVENOUS | Status: DC

## 2016-09-08 MED ORDER — ACETAMINOPHEN 325 MG PO TABS: 650.0000 mg | ORAL_TABLET | Freq: Four times a day (QID) | ORAL | Status: DC | PRN

## 2016-09-08 MED ORDER — DOCUSATE SODIUM 100 MG PO CAPS
100.0000 mg | ORAL_CAPSULE | Freq: Two times a day (BID) | ORAL | Status: DC
  Administered 2016-09-08: 100 mg via ORAL
  Filled 2016-09-08: qty 1

## 2016-09-08 SURGICAL SUPPLY — 62 items
BAG DECANTER FOR FLEXI CONT (MISCELLANEOUS) ×3 IMPLANT
BLADE SAGITTAL WIDE XTHICK NO (BLADE) ×3 IMPLANT
BONE CEMENT PALACOSE (Orthopedic Implant) ×3 IMPLANT
BOWL CEMENT MIX W/ADAPTER (MISCELLANEOUS) ×3 IMPLANT
CANISTER SUCT 1200ML W/VALVE (MISCELLANEOUS) ×3 IMPLANT
CANISTER SUCT 3000ML PPV (MISCELLANEOUS) ×6 IMPLANT
CAPT SHLDR TOTAL 2 ×3 IMPLANT
CATH TRAY METER 16FR LF (MISCELLANEOUS) ×3 IMPLANT
CEMENT BONE PALACOSE (Orthopedic Implant) ×1 IMPLANT
CHLORAPREP W/TINT 26ML (MISCELLANEOUS) ×6 IMPLANT
COOLER POLAR GLACIER W/PUMP (MISCELLANEOUS) ×3 IMPLANT
DECANTER SPIKE VIAL GLASS SM (MISCELLANEOUS) ×6 IMPLANT
DRAPE IMP U-DRAPE 54X76 (DRAPES) ×6 IMPLANT
DRAPE INCISE IOBAN 66X45 STRL (DRAPES) ×6 IMPLANT
DRAPE SHEET LG 3/4 BI-LAMINATE (DRAPES) ×6 IMPLANT
DRAPE TABLE BACK 80X90 (DRAPES) ×3 IMPLANT
DRSG OPSITE POSTOP 4X8 (GAUZE/BANDAGES/DRESSINGS) ×3 IMPLANT
ELECT CAUTERY BLADE 6.4 (BLADE) ×3 IMPLANT
GAUZE PACK 2X3YD (MISCELLANEOUS) ×3 IMPLANT
GLOVE BIO SURGEON STRL SZ7.5 (GLOVE) ×6 IMPLANT
GLOVE BIO SURGEON STRL SZ8 (GLOVE) ×6 IMPLANT
GLOVE BIOGEL PI IND STRL 8 (GLOVE) ×1 IMPLANT
GLOVE BIOGEL PI INDICATOR 8 (GLOVE) ×2
GLOVE INDICATOR 8.0 STRL GRN (GLOVE) ×3 IMPLANT
GOWN STRL REUS W/ TWL LRG LVL3 (GOWN DISPOSABLE) ×2 IMPLANT
GOWN STRL REUS W/ TWL XL LVL3 (GOWN DISPOSABLE) ×1 IMPLANT
GOWN STRL REUS W/TWL LRG LVL3 (GOWN DISPOSABLE) ×4
GOWN STRL REUS W/TWL XL LVL3 (GOWN DISPOSABLE) ×2
HANDPIECE INTERPULSE COAX TIP (DISPOSABLE) ×2
HOOD PEEL AWAY FLYTE STAYCOOL (MISCELLANEOUS) ×9 IMPLANT
KIT STABILIZATION SHOULDER (MISCELLANEOUS) ×3 IMPLANT
MASK FACE SPIDER DISP (MASK) ×3 IMPLANT
NDL MAYO CATGUT SZ1 (NEEDLE)
NDL MAYO CATGUT SZ5 (NEEDLE)
NDL SUT 5 .5 CRC TPR PNT MAYO (NEEDLE) IMPLANT
NEEDLE 18GX1X1/2 (RX/OR ONLY) (NEEDLE) ×6 IMPLANT
NEEDLE MAYO CATGUT SZ1 (NEEDLE) IMPLANT
NEEDLE MAYO CATGUT SZ4 (NEEDLE) IMPLANT
NEEDLE SPNL 20GX3.5 QUINCKE YW (NEEDLE) ×3 IMPLANT
NS IRRIG 1000ML POUR BTL (IV SOLUTION) ×3 IMPLANT
PACK ARTHROSCOPY SHOULDER (MISCELLANEOUS) ×3 IMPLANT
PAD WRAPON POLAR SHDR UNIV (MISCELLANEOUS) ×1 IMPLANT
PIN HUMERAL STMN 3.2MMX9IN (INSTRUMENTS) ×3 IMPLANT
SET HNDPC FAN SPRY TIP SCT (DISPOSABLE) ×1 IMPLANT
SLING ULTRA II M (MISCELLANEOUS) ×3 IMPLANT
SOL .9 NS 3000ML IRR  AL (IV SOLUTION) ×2
SOL .9 NS 3000ML IRR UROMATIC (IV SOLUTION) ×1 IMPLANT
SPONGE LAP 18X18 5 PK (GAUZE/BANDAGES/DRESSINGS) IMPLANT
STAPLER SKIN PROX 35W (STAPLE) ×3 IMPLANT
STRAP SAFETY BODY (MISCELLANEOUS) ×3 IMPLANT
SUT ETHIBOND 0 MO6 C/R (SUTURE) ×3 IMPLANT
SUT FIBERWIRE #2 38 BLUE 1/2 (SUTURE)
SUT TICRON 2-0 30IN 311381 (SUTURE) ×15 IMPLANT
SUT VIC AB 0 CT1 36 (SUTURE) ×6 IMPLANT
SUT VIC AB 2-0 CT1 27 (SUTURE) ×4
SUT VIC AB 2-0 CT1 TAPERPNT 27 (SUTURE) ×2 IMPLANT
SUTURE FIBERWR #2 38 BLUE 1/2 (SUTURE) IMPLANT
SYR 30ML LL (SYRINGE) ×6 IMPLANT
SYR TB 1ML LUER SLIP (SYRINGE) ×3 IMPLANT
SYRINGE 10CC LL (SYRINGE) ×3 IMPLANT
SYRINGE IRR TOOMEY STRL 70CC (SYRINGE) ×3 IMPLANT
WRAPON POLAR PAD SHDR UNIV (MISCELLANEOUS) ×3

## 2016-09-08 NOTE — Anesthesia Preprocedure Evaluation (Signed)
Anesthesia Evaluation  Patient identified by MRN, date of birth, ID band Patient awake    Reviewed: Allergy & Precautions, NPO status , Patient's Chart, lab work & pertinent test results, reviewed documented beta blocker date and time   History of Anesthesia Complications Negative for: history of anesthetic complications  Airway Mallampati: II  TM Distance: >3 FB Neck ROM: Full    Dental  (+) Lower Dentures, Upper Dentures   Pulmonary neg sleep apnea, COPD,  COPD inhaler, former smoker,    breath sounds clear to auscultation- rhonchi (-) wheezing      Cardiovascular hypertension, Pt. on medications and Pt. on home beta blockers (-) angina+ CABG  (-) Past MI  Rhythm:Regular Rate:Normal - Systolic murmurs and - Diastolic murmurs    Neuro/Psych Depression    GI/Hepatic Neg liver ROS, GERD  ,  Endo/Other  neg diabetesHypothyroidism   Renal/GU CRFRenal disease     Musculoskeletal  (+) Arthritis ,   Abdominal (+) - obese,   Peds  Hematology   Anesthesia Other Findings Past Medical History: No date: Arthritis No date: Cancer ([place]) No date: Clostridium difficile infection No date: Depression No date: GERD (gastroesophageal reflux disease) No date: Hypercholesterolemia No date: Hypertension No date: Hypothyroidism No date: Postherpetic neuralgia No date: Shingles No date: Vitamin B 12 deficiency   Reproductive/Obstetrics                             Anesthesia Physical Anesthesia Plan  ASA: II  Anesthesia Plan: General   Post-op Pain Management:  Regional for Post-op pain   Induction: Intravenous  Airway Management Planned: Oral ETT  Additional Equipment:   Intra-op Plan:   Post-operative Plan: Extubation in OR  Informed Consent: I have reviewed the patients History and Physical, chart, labs and discussed the procedure including the risks, benefits and alternatives for the  proposed anesthesia with the patient or authorized representative who has indicated his/her understanding and acceptance.   Dental advisory given  Plan Discussed with: CRNA and Anesthesiologist  Anesthesia Plan Comments: (Patient is DNR. Discussed that for the purposes of surgery and anesthesia we will be doing some degree of resuscitation and because of this we would suspend the DNR order during surgery. )        Anesthesia Quick Evaluation

## 2016-09-08 NOTE — Anesthesia Procedure Notes (Signed)
Procedure Name: Intubation Date/Time: 09/08/2016 10:45 AM Performed by: Marsh Dolly Pre-anesthesia Checklist: Patient identified, Patient being monitored, Timeout performed, Emergency Drugs available and Suction available Patient Re-evaluated:Patient Re-evaluated prior to inductionOxygen Delivery Method: Circle system utilized Preoxygenation: Pre-oxygenation with 100% oxygen Intubation Type: IV induction Ventilation: Mask ventilation without difficulty Laryngoscope Size: 3 and Miller Grade View: Grade I Tube type: Oral Tube size: 7.5 mm Number of attempts: 1 Placement Confirmation: ETT inserted through vocal cords under direct vision,  positive ETCO2 and breath sounds checked- equal and bilateral Secured at: 21 cm Tube secured with: Tape Dental Injury: Teeth and Oropharynx as per pre-operative assessment

## 2016-09-08 NOTE — Evaluation (Signed)
Physical Therapy Evaluation Patient Details Name: Samuel Ortiz MRN: [id number] DOB: 1934-02-29 Today's Date: 09/08/2016   History of Present Illness  Pt is admitted for R TSR.   Clinical Impression  Pt is a pleasant 82 year old male who was admitted for R TSR. Pt performs bed mobility with cga, transfers with supervision, and ambulation with cga and no AD. Pt demonstrates deficits with strength/balance/mobility. Recommend using SPC for continued ambulation to improve balance. Has SPC in room, however rubber slide very slick. Brought SPC from therapy to borrow while in hospital.  Would benefit from skilled PT to address above deficits and promote optimal return to PLOF. Recommend transition to [place] upon discharge from acute hospitalization.       Follow Up Recommendations Home health PT;Supervision for mobility/OOB    Equipment Recommendations       Recommendations for Other Services       Precautions / Restrictions Precautions Precautions: Fall;Shoulder Shoulder Interventions: Shoulder abduction pillow;Shoulder sling/immobilizer Precaution Booklet Issued: No Restrictions Weight Bearing Restrictions: Yes RUE Weight Bearing: Non weight bearing      Mobility  Bed Mobility Overal bed mobility: Needs Assistance Bed Mobility: Supine to Sit     Supine to sit: Min guard     General bed mobility comments: cues for sequencing given. Safe technique performed.  Pt able to initiate scooting out towards EOB. Once seated at EOB, able to sit with independence.  Transfers Overall transfer level: Needs assistance Equipment used: None Transfers: Sit to/from Stand Sit to Stand: Supervision         General transfer comment: transfer performed with safe technique. No AD required.  Ambulation/Gait Ambulation/Gait assistance: Min guard Ambulation Distance (Feet): 3 Feet Assistive device: None Gait Pattern/deviations: Step-to pattern     General Gait Details: ambulated using  step to gait pattern towards recliner chair. Safe technique performed Slight unsteadiness noted. Would recommend using SPC for improved steadiness.   Stairs            Wheelchair Mobility    Modified Rankin (Stroke Patients Only)       Balance Overall balance assessment: History of Falls;Needs assistance Sitting-balance support: Feet supported Sitting balance-Leahy Scale: Good     Standing balance support: No upper extremity supported Standing balance-Leahy Scale: Fair                               Pertinent Vitals/Pain Pain Assessment: No/denies pain    Home Living Family/patient expects to be discharged to:: Private residence Living Arrangements: Spouse/significant other Available Help at Discharge: Family Type of Home: House Home Access: Stairs to enter Entrance Stairs-Rails: Left Entrance Stairs-Number of Steps: 4 Home Layout: One level Home Equipment: Cane - single point;Wheelchair - manual      Prior Function Level of Independence: Independent         Comments: Pt reports using SPC for most ambulatory needs     Hand Dominance        Extremity/Trunk Assessment   Upper Extremity Assessment:  (R UE not tested, L LE grossly 5/5)           Lower Extremity Assessment: Overall WFL for tasks assessed         Communication   Communication: No difficulties  Cognition Arousal/Alertness: Awake/alert Behavior During Therapy: WFL for tasks assessed/performed Overall Cognitive Status: Within Functional Limits for tasks assessed  General Comments      Exercises     Assessment/Plan    PT Assessment Patient needs continued PT services  PT Problem List Decreased strength;Decreased activity tolerance;Decreased balance;Decreased mobility;Decreased safety awareness;Decreased knowledge of use of DME          PT Treatment Interventions DME instruction;Gait training;Therapeutic activities;Therapeutic  exercise    PT Goals (Current goals can be found in the Care Plan section)  Acute Rehab PT Goals Patient Stated Goal: to go home PT Goal Formulation: With patient Time For Goal Achievement: 09/22/16 Potential to Achieve Goals: Good    Frequency BID   Barriers to discharge        Co-evaluation               End of Session   Activity Tolerance: Patient tolerated treatment well Patient left: in chair;with chair alarm set Nurse Communication: Mobility status         Time: [id number] PT Time Calculation (min) (ACUTE ONLY): 20 min   Charges:   PT Evaluation $PT Eval Low Complexity: 1 Procedure     PT G Codes:        Antoria Lanza 09-23-2016, 4:58 PM  Greggory Stallion, PT, DPT [phone]

## 2016-09-08 NOTE — Transfer of Care (Signed)
Immediate Anesthesia Transfer of Care Note  Patient: Samuel Ortiz  Procedure(s) Performed: Procedure(s): TOTAL SHOULDER ARTHROPLASTY (Right)  Patient Location: PACU  Anesthesia Type:General and Regional  Level of Consciousness: awake, alert  and oriented  Airway & Oxygen Therapy: Patient Spontanous Breathing and Patient connected to face mask oxygen  Post-op Assessment: Post -op Vital signs reviewed and stable  Post vital signs: Reviewed and stable  Last Vitals:  Vitals:   09/08/16 1020 09/08/16 1030  BP: 100/65   Pulse: (!) 58 (!) 57  Resp: 14 14  Temp:      Last Pain:  Vitals:   09/08/16 0935  TempSrc: Tympanic  PainSc: 7          Complications: No apparent anesthesia complications

## 2016-09-08 NOTE — H&P (Signed)
Paper H&P to be scanned into permanent record. H&P reviewed. No changes. 

## 2016-09-08 NOTE — Anesthesia Postprocedure Evaluation (Signed)
Anesthesia Post Note  Patient: Samuel Ortiz  Procedure(s) Performed: Procedure(s) (LRB): TOTAL SHOULDER ARTHROPLASTY (Right)  Patient location during evaluation: PACU Anesthesia Type: General Level of consciousness: awake and alert Pain management: pain level controlled Vital Signs Assessment: post-procedure vital signs reviewed and stable Respiratory status: spontaneous breathing, nonlabored ventilation, respiratory function stable and patient connected to nasal cannula oxygen Cardiovascular status: blood pressure returned to baseline and stable Postop Assessment: no signs of nausea or vomiting Anesthetic complications: no    Last Vitals:  Vitals:   09/08/16 1520 09/08/16 1645  BP: 121/65 126/66  Pulse: 73 71  Resp: 16 16  Temp:      Last Pain:  Vitals:   09/08/16 1445  TempSrc: Oral  PainSc: 0-No pain                 Precious Haws Piscitello

## 2016-09-08 NOTE — NC FL2 (Signed)
[place] LEVEL OF CARE SCREENING TOOL     IDENTIFICATION  Patient Name: Samuel Ortiz Birthdate: 1934/02/24 Sex: male Admission Date (Current Location): 09/08/2016  [place] and [place] Number:  Engineering geologist and Address:  [facility], [address] [place], [place] [address]      Provider Number: [id number]  Attending Physician Name and Address:  Corky Mull, MD  Relative Name and Phone Number:       Current Level of Care: Hospital Recommended Level of Care: [place] Prior Approval Number:    Date Approved/Denied:   PASRR Number:  ([id number])  Discharge Plan: SNF    Current Diagnoses: Patient Active Problem List   Diagnosis Date Noted  . Status post total shoulder replacement, right 09/08/2016  . AKI (acute kidney injury) ([place]) 07/22/2016  . 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 Acute pain of right shoulder 06/02/2016  . Primary osteoarthritis of right shoulder 06/02/2016  . Hypotension 04/03/2016  . Clostridium difficile diarrhea 04/03/2016  . GI bleed 04/03/2016  . Dysphagia 04/03/2016  . Syncope 04/03/2016  . Aspiration pneumonia ([place]) 04/03/2016  . Shingles 04/03/2016  . Post herpetic neuralgia 04/03/2016  . Elevated troponin 04/03/2016  . Hypothyroidism 04/03/2016  . Physical deconditioning 04/03/2016  . History of throat cancer 04/03/2016  . Pneumonia of right lower lobe due to infectious organism ([place]) 03/25/2016  . Severe sepsis ([place]) 03/25/2016  . History of esophageal stricture 03/22/2016  . Community acquired pneumonia 03/21/2016  . Hypertension 03/21/2016  . Sepsis ([place]) 03/21/2016  . COPD, mild ([place]) 09/17/2014  . Back muscle spasm 08/22/2014  . DDD (degenerative disc disease), lumbar 08/21/2014  . Depression 07/19/2014  . Essential hypertension 07/19/2014  . Hyperlipidemia, unspecified 07/19/2014    Orientation RESPIRATION BLADDER Height & Weight     Self, Time,  Situation, Place  Normal External catheter Weight: 210 lb (95.3 kg) Height:  6' (182.9 cm)  BEHAVIORAL SYMPTOMS/MOOD NEUROLOGICAL BOWEL NUTRITION STATUS   (None)  (None) Continent Diet (Diet: Clear Liquid)  AMBULATORY STATUS COMMUNICATION OF NEEDS Skin   Extensive Assist Verbally Surgical wounds (Incision: Right Shoulder )                       Personal Care Assistance Level of Assistance  Feeding, Bathing, Dressing Bathing Assistance: Limited assistance Feeding assistance: Independent Dressing Assistance: Limited assistance     Functional Limitations Info  Sight, Hearing, Speech Sight Info: Adequate Hearing Info: Adequate Speech Info: Adequate    SPECIAL CARE FACTORS FREQUENCY  PT (By licensed PT), OT (By licensed OT)     PT Frequency:  (5) OT Frequency:  (5)            Contractures      Additional Factors Info  Code Status, Allergies Code Status Info:  (DNR) Allergies Info:  (Morphine And Related)           Current Medications (09/08/2016):  This is the current hospital active medication list Current Facility-Administered Medications  Medication Dose Route Frequency Provider Last Rate Last Dose  . acetaminophen (TYLENOL) tablet 650 mg  650 mg Oral Q6H PRN Corky Mull, MD       Or  . acetaminophen (TYLENOL) suppository 650 mg  650 mg Rectal Q6H PRN Corky Mull, MD      . acetaminophen (TYLENOL) tablet 1,000 mg  1,000 mg Oral Q6H Corky Mull, MD      . acidophilus (RISAQUAD) capsule 2  capsule  2 capsule Oral Daily Marshall Cork Poggi, MD      . albuterol (PROVENTIL) (2.5 MG/3ML) 0.083% nebulizer solution 2.5 mg  2.5 mg Inhalation Q6H PRN Corky Mull, MD      . aspirin EC tablet 81 mg  81 mg Oral Daily Corky Mull, MD      . bisacodyl (DULCOLAX) suppository 10 mg  10 mg Rectal Daily PRN Corky Mull, MD      . carvedilol (COREG) tablet 3.125 mg  3.125 mg Oral BID WC Corky Mull, MD      . ceFAZolin (ANCEF) 2-4 GM/100ML-% IVPB           . ceFAZolin (ANCEF)  IVPB 2g/100 mL premix  2 g Intravenous Q6H Marshall Cork Poggi, MD      . dextrose 5 % and 0.9 % NaCl with KCl 20 mEq/L infusion   Intravenous Continuous Corky Mull, MD      . diphenhydrAMINE (BENADRYL) 12.5 MG/5ML elixir 12.5-25 mg  12.5-25 mg Oral Q4H PRN Corky Mull, MD      . docusate sodium (COLACE) capsule 100 mg  100 mg Oral Daily Corky Mull, MD      . docusate sodium (COLACE) capsule 100 mg  100 mg Oral BID Corky Mull, MD      . Derrill Memo ON 09/09/2016] enoxaparin (LOVENOX) injection 40 mg  40 mg Subcutaneous Q24H Corky Mull, MD      . feeding supplement (ENSURE ENLIVE) (ENSURE ENLIVE) liquid 237 mL  237 mL Oral TID BM Corky Mull, MD      . gabapentin (NEURONTIN) capsule 900 mg  900 mg Oral TID Corky Mull, MD      . levothyroxine (SYNTHROID, LEVOTHROID) tablet 200 mcg  200 mcg Oral QAC breakfast Corky Mull, MD      . levothyroxine (SYNTHROID, LEVOTHROID) tablet 25 mcg  25 mcg Oral QAC breakfast Corky Mull, MD      . lidocaine (PF) (XYLOCAINE) 1 % injection           . lisinopril (PRINIVIL,ZESTRIL) tablet 10 mg  10 mg Oral Daily Corky Mull, MD      . magnesium hydroxide (MILK OF MAGNESIA) suspension 30 mL  30 mL Oral Daily PRN Corky Mull, MD      . metoCLOPramide (REGLAN) tablet 5-10 mg  5-10 mg Oral Q8H PRN Corky Mull, MD       Or  . metoCLOPramide (REGLAN) injection 5-10 mg  5-10 mg Intravenous Q8H PRN Corky Mull, MD      . midazolam (VERSED) 5 MG/5ML injection           . ondansetron (ZOFRAN) tablet 4 mg  4 mg Oral Q6H PRN Corky Mull, MD       Or  . ondansetron [facility]) injection 4 mg  4 mg Intravenous Q6H PRN Corky Mull, MD      . oxyCODONE (Oxy IR/ROXICODONE) immediate release tablet 5-10 mg  5-10 mg Oral Q3H PRN Corky Mull, MD      . pantoprazole (PROTONIX) EC tablet 40 mg  40 mg Oral BID Corky Mull, MD      . ropivacaine (PF) (NAROPIN) 5 MG/ML injection           . sertraline (ZOLOFT) tablet 100 mg  100 mg Oral Daily Corky Mull, MD      . sertraline  (ZOLOFT) tablet 25 mg  25 mg Oral Daily Corky Mull, MD      . simvastatin (ZOCOR) tablet 20 mg  20 mg Oral QHS Corky Mull, MD      . sodium phosphate (FLEET) 7-19 GM/118ML enema 1 enema  1 enema Rectal Once PRN Corky Mull, MD         Discharge Medications: Please see discharge summary for a list of discharge medications.  Relevant Imaging Results:  Relevant Lab Results:   Additional Information  (SSN: [id number])  Danie Chandler, Student-Social Work

## 2016-09-08 NOTE — Op Note (Signed)
09/08/2016  1:12 PM  Patient:   Samuel Ortiz  Pre-Op Diagnosis:   Degenerative joint disease, right shoulder.  Post-Op Diagnosis:   Same.  Procedure:   Right total shoulder arthroplasty.  Surgeon:   Pascal Lux, MD  Assistant:   Cameron Proud, PA-C  Anesthesia:   General endotracheal intubation with an interscalene block.  Findings:   As above. The rotator cuff was in satisfactory condition.  Complications:   None  EBL:  225 cc  Fluids:   1000 cc crystalloid  UOP:   150 cc  TT:   None  Drains:   None  Closure:   Staples  Implants:   Biomet Comprehensive system with a pressfit #16 mini-humeral stem, a 58 x 24 mm humeral head, and a medium cemented glenoid component with a Regenerex post.  Brief Clinical Note:   The patient is an 82 year old male with a long history of gradually worsening right shoulder pain. His symptoms have progressed despite medications, activity modification, etc. His history and examination were consistent with advanced degenerative joint disease the glenohumeral joint. An MRI scan demonstrated that his rotator cuff remained in good condition. Therefore, the patient presents at this time for a right total shoulder arthroplasty.  Procedure:   The patient underwent placement of an interscalene block in the preoperative holding area by the anesthesiologist before he was brought into the operating room and lain in the supine position on the OR table. The patient then underwent general endotracheal intubation and anesthesia before a Foley catheter was placed by the nurse. The patient was repositioned in the beach chair position using the beach chair positioner. The right shoulder and upper extremity were prepped with ChloraPrep solution before being draped sterilely. Preoperative antibiotics were administered. A standard anterior approach to the shoulder was made through an approximately 4-5 inch incision. The incision was carried down through the  subcutaneous tissues to expose the deltopectoral fascia. The interval between the deltoid and pectoralis muscles was identified and this plane developed, retracting the cephalic vein laterally with the deltoid muscle. The conjoined tendon was identified. The lateral margin was dissected and the Kolbel self-retraining retractor inserted. The "three sisters" were identified and cauterized. Bursal tissues were removed to improve visualization. The subscapularis tendon was released from its attachment to the lesser tuberosity 1 cm proximal to its insertion and several tagging sutures placed. The inferior capsule was released with care after identifying and protecting the axillary nerve. The proximal humeral cut was made at approximately 30 of retroversion using the extra-medullary guide.   Attention was directed to the glenoid. The labrum was debrided circumferentially before the center of the glenoid was marked with electrocautery. The large and medium sizers were positioned and it was elected to proceed with a medium glenoid component. The guidewire was drilled into the glenoid neck using the appropriate guide. After verifying its position, the glenoid was lightly reamed with the butterfly reamer before the centralizing Regenerex post reamer was used. The small peripheral peg guide was positioned and each of the three pegs drilled sequentially, leaving the preceding drill bit in place so as to minimize shifting of the guide. The bony surfaces were prepared for cementing by irrigating them thoroughly with bacitracin saline solution using the jet lavage system, then packing the glenoid with a Neo-Synephrine soaked sponge. Meanwhile, cement was mixed on the back table. When it was ready, some cement was injected into each of the three peg holes using a Toomey syringe and additional cement applied  to the posterior aspect of the glenoid component. The component was impacted into place and the excess cement was removed  using a Surveyor, quantity. Pressure was maintained on the glenoid until the cement hardened.  Attention was directed to the humeral side. The humeral canal was reamed sequentially beginning with the end-cutting reamer then progressing from a 4 mm reamer up to a 16 mm reamer. This provided excellent circumferential chatter. The canal was broached beginning with a #5 broach and progressing to a #16 broach. This was left in place and several trial reductions performed using the 54 x 21 mm, the 54 x 24 mm, and the 58 x 24 mm trial humeral heads. Utilizing the last trial head size, the arm demonstrated excellent range of motion as the hand could be brought across the chest to the opposite shoulder and brought to the top of the patient's head and to the patient's ear. The shoulder remained stable throughout this range of motion, and was stable with abduction and external rotation. The joint was dislocated and the trial components removed. The permanent #16 mini-stem was impacted into place with care taken to maintain the appropriate version. The permanent 58 x 24 mm humeral head with the standard taper set in the "A" position was put together on the back table and impacted into place. Again, the [facility] taper locking mechanism was verified using manual distraction. The shoulder was relocated and again placed through a range of motion with the findings as described above.  The wound was copiously irrigated with bacitracin saline solution using the jet lavage system before a total of 20 cc of Exparel diluted out to 60 cc with normal saline and 30 cc of 0.5% Sensorcaine with epinephrine was injected into the pericapsular and peri-incisional tissues to help with postoperative analgesia. The subscapularis tendon was reapproximated using #2 FiberWire interrupted sutures. The deltopectoral interval was closed using #0 Vicryl interrupted sutures before the subcutaneous tissues were closed using 2-0 Vicryl interrupted sutures.  The skin was closed using staples. During the closure, 1 g of transexemic acid in 10 cc of normal saline was injected intravenously to help with postoperative bleeding. A sterile occlusive dressing was applied to the wound before the arm was placed into a shoulder immobilizer with an abduction pillow. A polar pack system also was applied to the shoulder. The patient was then transferred back to a hospital bed before being awakened, extubated, and returned to the recovery room in satisfactory condition after tolerating the procedure well.

## 2016-09-08 NOTE — Anesthesia Procedure Notes (Signed)
Anesthesia Regional Block:  Interscalene brachial plexus block  Pre-Anesthetic Checklist: ,, timeout performed, Correct Patient, Correct Site, Correct Laterality, Correct Procedure, Correct Position, site marked, Risks and benefits discussed,  Surgical consent,  Pre-op evaluation,  At surgeon's request and post-op pain management  Laterality: Right  Prep: chloraprep       Needles:  Injection technique: Single-shot  Needle Type: Stimiplex     Needle Length: 9cm 9 cm Needle Gauge: 22 and 22 G    Additional Needles:  Procedures: ultrasound guided (picture in chart) Interscalene brachial plexus block Narrative:  Injection made incrementally with aspirations every 5 mL.  Performed by: Personally  Anesthesiologist: Ami Mally  Additional Notes: Functioning IV was confirmed and monitors were applied.  A 108mm 22ga Stimuplex needle was used. Sterile prep and drape,hand hygiene and sterile gloves were used.  Negative aspiration and negative test dose prior to incremental administration of local anesthetic. The patient tolerated the procedure well.

## 2016-09-09 LAB — CBC WITH DIFFERENTIAL/PLATELET
BASOS PCT: 1 %
Basophils Absolute: 0.1 10*3/uL (ref 0–0.1)
EOS ABS: 0.3 10*3/uL (ref 0–0.7)
Eosinophils Relative: 3 %
HEMATOCRIT: 34.3 % — AB (ref 40.0–52.0)
HEMOGLOBIN: 11.4 g/dL — AB (ref 13.0–18.0)
LYMPHS ABS: 1.6 10*3/uL (ref 1.0–3.6)
Lymphocytes Relative: 16 %
MCH: 27.9 pg (ref 26.0–34.0)
MCHC: 33.3 g/dL (ref 32.0–36.0)
MCV: 83.9 fL (ref 80.0–100.0)
Monocytes Absolute: 0.8 10*3/uL (ref 0.2–1.0)
Monocytes Relative: 8 %
NEUTROS ABS: 7.6 10*3/uL — AB (ref 1.4–6.5)
NEUTROS PCT: 72 %
Platelets: 195 10*3/uL (ref 150–440)
RBC: 4.09 MIL/uL — AB (ref 4.40–5.90)
RDW: 16.5 % — ABNORMAL HIGH (ref 11.5–14.5)
WBC: 10.3 10*3/uL (ref 3.8–10.6)

## 2016-09-09 LAB — BASIC METABOLIC PANEL
Anion gap: 5 (ref 5–15)
BUN: 24 mg/dL — ABNORMAL HIGH (ref 6–20)
CHLORIDE: 107 mmol/L (ref 101–111)
CO2: 26 mmol/L (ref 22–32)
Calcium: 8.1 mg/dL — ABNORMAL LOW (ref 8.9–10.3)
Creatinine, Ser: 1.11 mg/dL (ref 0.61–1.24)
GFR calc non Af Amer: >60 mL/min (ref >60)
Glucose, Bld: 109 mg/dL — ABNORMAL HIGH (ref 65–99)
POTASSIUM: 4.6 mmol/L (ref 3.5–5.1)
SODIUM: 138 mmol/L (ref 135–145)

## 2016-09-09 MED ORDER — MEPERIDINE HCL 25 MG/ML IJ SOLN
25.0000 mg | Freq: Once | INTRAMUSCULAR | Status: AC
  Administered 2016-09-09: 25 mg via INTRAVENOUS
  Filled 2016-09-09: qty 1

## 2016-09-09 MED ORDER — OXYCODONE HCL 5 MG PO TABS: 5.0000 mg | ORAL_TABLET | ORAL | 0 refills | Status: AC | PRN

## 2016-09-09 MED ORDER — MEPERIDINE HCL 25 MG/ML IJ SOLN: 12.5000 mg | INTRAMUSCULAR | Status: DC | PRN

## 2016-09-09 MED ORDER — PROMETHAZINE HCL 25 MG/ML IJ SOLN
12.5000 mg | Freq: Once | INTRAMUSCULAR | Status: AC
Start: 2016-09-09 — End: 2016-09-09
  Administered 2016-09-09: 12.5 mg via INTRAVENOUS
  Filled 2016-09-09: qty 1

## 2016-09-09 NOTE — Discharge Summary (Signed)
Physician Discharge Summary  Patient ID: Samuel Ortiz MRN: [id number] DOB/AGE: August 26, 1934 82 y.o.  Admit date: 09/08/2016 Discharge date: 09/09/2016  Admission Diagnoses:  OSTEOARTHRITIS RIGHT SHOULDER Degenerative joint disease of the right shoulder.  Discharge Diagnoses: Patient Active Problem List   Diagnosis Date Noted  . Status post total shoulder replacement, right 09/08/2016  . AKI (acute kidney injury) ([place]) 07/22/2016  . 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 Acute pain of right shoulder 06/02/2016  . Primary osteoarthritis of right shoulder 06/02/2016  . Hypotension 04/03/2016  . Clostridium difficile diarrhea 04/03/2016  . GI bleed 04/03/2016  . Dysphagia 04/03/2016  . Syncope 04/03/2016  . Aspiration pneumonia ([place]) 04/03/2016  . Shingles 04/03/2016  . Post herpetic neuralgia 04/03/2016  . Elevated troponin 04/03/2016  . Hypothyroidism 04/03/2016  . Physical deconditioning 04/03/2016  . History of throat cancer 04/03/2016  . Pneumonia of right lower lobe due to infectious organism ([place]) 03/25/2016  . Severe sepsis ([place]) 03/25/2016  . History of esophageal stricture 03/22/2016  . Community acquired pneumonia 03/21/2016  . Hypertension 03/21/2016  . Sepsis ([place]) 03/21/2016  . COPD, mild ([place]) 09/17/2014  . Back muscle spasm 08/22/2014  . DDD (degenerative disc disease), lumbar 08/21/2014  . Depression 07/19/2014  . Essential hypertension 07/19/2014  . Hyperlipidemia, unspecified 07/19/2014  Degenerative joint disease of the right shoulder.  Past Medical History:  Diagnosis Date  . Arthritis   . Cancer ([place])   . Clostridium difficile infection   . Depression   . GERD (gastroesophageal reflux disease)   . Hypercholesterolemia   . Hypertension   . Hypothyroidism   . Postherpetic neuralgia   . Shingles   . Vitamin B 12 deficiency      Transfusion: None   Consultants (if any):   Discharged Condition: Improved  Hospital Course: Samuel Ortiz is an 83 y.o. male who was admitted 09/08/2016 with a diagnosis of joint disease of the right shoulder and went to the operating room on 09/08/2016 and underwent the above named procedures.    Surgeries: Procedure(s): TOTAL SHOULDER ARTHROPLASTY on 09/08/2016 Patient tolerated the surgery well. Taken to PACU where she was stabilized and then transferred to the orthopedic floor.  Started on Lovenox 40mg  q 24 hrs. Foot pumps applied bilaterally at 80 mm. Heels elevated on bed with rolled towels. No evidence of DVT. Negative [place]. Physical therapy started on day #1 for gait training and transfer. OT started day #1 for ADL and assisted devices.  Patient's IV and Foley removed on POD1  Implants: Biomet Comprehensive system with a pressfit #16 mini-humeral stem, a 58 x 24 mm humeral head, and a medium cemented glenoid component with a Regenerex post.   He was given perioperative antibiotics:  Anti-infectives    Start     Dose/Rate Route Frequency Ordered Stop   09/08/16 1700  ceFAZolin (ANCEF) IVPB 2g/100 mL premix     2 g 200 mL/hr over 30 Minutes Intravenous Every 6 hours 09/08/16 1445 09/09/16 0515   09/08/16 0654  ceFAZolin (ANCEF) 2-4 GM/100ML-% IVPB    Comments:  STOLLEY, LORI: cabinet override      09/08/16 0654 09/08/16 1859   09/08/16 0500  ceFAZolin (ANCEF) IVPB 2g/100 mL premix  Status:  Discontinued     2 g 200 mL/hr over 30 Minutes Intravenous  Once 09/08/16 0450 09/08/16 1444    .  He was given sequential compression devices, early ambulation, and Lovenox for DVT prophylaxis.  He benefited maximally from the hospital stay and there  were no complications.    Recent vital signs:  Vitals:   09/09/16 0438 09/09/16 0737  BP: 133/72 (!) 130/99  Pulse: 63 (!) 56  Resp: 19 18  Temp: 97.6 F (36.4 C) 97.8 F (36.6 C)    Recent laboratory studies:  Lab Results  Component Value Date   HGB 11.4 (L) 09/09/2016   HGB 13.1 09/02/2016   HGB 10.8 (L) 04/08/2016    Lab Results  Component Value Date   WBC 10.3 09/09/2016   PLT 195 09/09/2016   Lab Results  Component Value Date   INR 1.02 09/02/2016   Lab Results  Component Value Date   NA 138 09/09/2016   K 4.6 09/09/2016   CL 107 09/09/2016   CO2 26 09/09/2016   BUN 24 (H) 09/09/2016   CREATININE 1.11 09/09/2016   GLUCOSE 109 (H) 09/09/2016    Discharge Medications:     Medication List    TAKE these medications   acidophilus Caps capsule Take 2 capsules by mouth daily.   albuterol 108 (90 Base) MCG/ACT inhaler Commonly known as:  PROVENTIL HFA;VENTOLIN HFA Inhale 2 puffs into the lungs every 6 (six) hours as needed for wheezing or shortness of breath.   aspirin EC 81 MG tablet Take 81 mg by mouth daily.   carvedilol 3.125 MG tablet Commonly known as:  COREG Take 3.125 mg by mouth 2 (two) times daily with a meal. Reported on 04/07/2016   docusate sodium 100 MG capsule Commonly known as:  COLACE Take 100 mg by mouth daily.   ERGOCALCIFEROL PO Take by mouth.   feeding supplement (ENSURE ENLIVE) Liqd Take 237 mLs by mouth 3 (three) times daily between meals.   ferrous sulfate 325 (65 FE) MG tablet Take 325 mg by mouth.   gabapentin 300 MG capsule Commonly known as:  NEURONTIN Take 900 mg by mouth 3 (three) times daily.   HYDROcodone-acetaminophen 5-325 MG tablet Commonly known as:  NORCO/VICODIN Take 1 tablet by mouth every 6 (six) hours as needed for moderate pain. What changed:  when to take this   lisinopril 10 MG tablet Commonly known as:  PRINIVIL,ZESTRIL Take 10 mg by mouth daily.   MELOXICAM PO Take by mouth.   OMEPRAZOLE PO Take by mouth.   oxyCODONE 5 MG immediate release tablet Commonly known as:  Oxy IR/ROXICODONE Take 1-2 tablets (5-10 mg total) by mouth every 4 (four) hours as needed for breakthrough pain.   pantoprazole 40 MG tablet Commonly known as:  PROTONIX Take 1 tablet (40 mg total) by mouth 2 (two) times daily.   sertraline 100  MG tablet Commonly known as:  ZOLOFT Take 100 mg by mouth daily.   sertraline 25 MG tablet Commonly known as:  ZOLOFT Take 25 mg by mouth daily.   simvastatin 20 MG tablet Commonly known as:  ZOCOR Take 20 mg by mouth at bedtime.   SYNTHROID 25 MCG tablet Generic drug:  levothyroxine Take 25 mcg by mouth daily before breakfast.   levothyroxine 100 MCG tablet Commonly known as:  SYNTHROID, LEVOTHROID Take 200 mcg by mouth daily before breakfast.   vancomycin 50 mg/mL oral solution Commonly known as:  VANCOCIN Take 5 mLs (250 mg total) by mouth every 6 (six) hours. 10 more days then stop   VITAMIN B-12 PO Take by mouth.       Diagnostic Studies: Mr Shoulder Right Wo Contrast  Result Date: 08/14/2016 CLINICAL DATA:  Osteoarthritis of the right shoulder. Decreased range of motion. EXAM:  MRI OF THE RIGHT SHOULDER WITHOUT CONTRAST TECHNIQUE: Multiplanar, multisequence MR imaging of the shoulder was performed. No intravenous contrast was administered. COMPARISON:  None. FINDINGS: Rotator cuff: Mild tendinosis of the supraspinatus and infraspinatus tendons. Teres minor tendon is intact. Mild tendinosis of the subscapularis tendon. Muscles: No atrophy or fatty replacement of nor abnormal signal within, the muscles of the rotator cuff. Biceps long head: Severe tendinosis of the intraarticular portion of the long of the biceps tendon. Acromioclavicular Joint: Severe degenerative changes of the acromioclavicular joint. Type I acromion. No significant subacromial/subdeltoid bursal fluid. Glenohumeral Joint: Small joint effusion with synovitis. Extensive full-thickness cartilage loss of the humeral head and glenoid with subchondral reactive marrow changes more severe in the glenoid. Labrum:  Diffuse labral degeneration. Bones: No other marrow signal abnormality. No acute fracture or dislocation. Other: No fluid collection or hematoma. IMPRESSION: 1. Severe osteoarthritis of the right glenohumeral  joint. 2. Severe tendinosis of the intraarticular portion of the long head of the biceps tendon. 3. Mild tendinosis of the supraspinatus, infraspinatus and subscapularis tendons. 4. Small joint effusion with synovitis. 5. Severe degenerative changes of the acromioclavicular joint. Electronically Signed   By: Kathreen Devoid   On: 08/14/2016 10:00   Dg Shoulder Right Port  Result Date: 09/08/2016 CLINICAL DATA:  Postoperative radiographs following right shoulder replacement. EXAM: PORTABLE RIGHT SHOULDER COMPARISON:  Preoperative examination of April 08, 2016 FINDINGS: The patient has undergone total joint replacement of the right shoulder. Radiographic positioning of the prosthetic components is good. The interface with the native bone is normal. There are stable degenerative changes of the [facility] joint. IMPRESSION: Status post total right shoulder prosthesis placement without evidence of immediate postprocedure complication. Electronically Signed   By: David  Martinique M.D.   On: 09/08/2016 13:52   Disposition: Plan will be for discharge home today with HHPT.  Follow-up Information    Judson Roch, PA-C Follow up in 14 day(s).   Specialty:  Physician Assistant Why:  Levert Feinstein Removal Contact information: [place] [address] [phone]          Signed: Judson Roch PA-C 09/09/2016, 8:03 AM

## 2016-09-09 NOTE — Progress Notes (Signed)
Clinical Social Worker (CSW) received SNF consult. PT is recommending home health. RN case manager is aware of above. Please reconsult if future social work needs arise. CSW signing off.   Sheina Mcleish, LCSW [phone] 

## 2016-09-09 NOTE — Progress Notes (Signed)
Physical Therapy Treatment Patient Details Name: Samuel Ortiz MRN: [id number] DOB: 08-29-1934 Today's Date: 09/09/2016    History of Present Illness Pt is admitted for R TSR.     PT Comments    Samuel Ortiz made good progress today.  He requires min guard for safety with ambulation and stair training but was able to complete stair training ascending/descending 6 steps.  HHPT remains most appropriate d/c plan for pt at this time.   Follow Up Recommendations  Home health PT;Supervision for mobility/OOB     Equipment Recommendations       Recommendations for Other Services       Precautions / Restrictions Precautions Precautions: Fall;Shoulder Shoulder Interventions: Shoulder abduction pillow;Shoulder sling/immobilizer Precaution Booklet Issued: No Restrictions Weight Bearing Restrictions: Yes RUE Weight Bearing: Non weight bearing    Mobility  Bed Mobility Overal bed mobility: Needs Assistance Bed Mobility: Supine to Sit     Supine to sit: Supervision;HOB elevated     General bed mobility comments: Increased time an effort.  Supervision for safety.  Transfers Overall transfer level: Needs assistance Equipment used: None Transfers: Sit to/from Stand Sit to Stand: Supervision         General transfer comment: transfer performed with safe technique. No AD required.  Ambulation/Gait Ambulation/Gait assistance: Min guard Ambulation Distance (Feet): 70 Feet Assistive device: Straight cane Gait Pattern/deviations: Step-through pattern;Decreased stride length Gait velocity: decreased Gait velocity interpretation: Below normal speed for age/gender General Gait Details: Pt requires min guard assist for safety due to mild instability but no LOB.     Stairs Stairs: Yes Stairs assistance: Min guard Stair Management: One rail Left;Alternating pattern;Forwards Number of Stairs: 6 General stair comments: Pt uses L rail with min guard for safety.  Advised pt to  have someone with him when going up his steps at home.  Says his steps form the garage are 2 with Bil rails  Wheelchair Mobility    Modified Rankin (Stroke Patients Only)       Balance Overall balance assessment: Needs assistance Sitting-balance support: No upper extremity supported;Feet supported Sitting balance-Leahy Scale: Good     Standing balance support: No upper extremity supported Standing balance-Leahy Scale: Fair                      Cognition Arousal/Alertness: Awake/alert Behavior During Therapy: WFL for tasks assessed/performed Overall Cognitive Status: Within Functional Limits for tasks assessed                      Exercises      General Comments        Pertinent Vitals/Pain Pain Assessment: 0-10 (Simultaneous filing. User may not have seen previous data.) Pain Score: 8     Home Living Family/patient expects to be discharged to:: Private residence Living Arrangements: Spouse/significant other Available Help at Discharge: Family Type of Home: House Home Access: Stairs to enter Entrance Stairs-Rails: Left Home Layout: One level [place] - single point;Wheelchair - manual      Prior Function Level of Independence: Independent          PT Goals (current goals can now be found in the care plan section) Acute Rehab PT Goals Patient Stated Goal: to go home PT Goal Formulation: With patient Time For Goal Achievement: 09/22/16 Potential to Achieve Goals: Good Progress towards PT goals: Progressing toward goals    Frequency    BID      PT Plan Current plan remains appropriate  Co-evaluation             End of Session Equipment Utilized During Treatment: Gait belt Activity Tolerance: Patient tolerated treatment well Patient left: in bed;with bed alarm set     Time: [id number] PT Time Calculation (min) (ACUTE ONLY): 16 min  Charges:  $Gait Training: 8-22 mins                    G Codes:        [name] PT, DPT 09/09/2016, 10:35 AM

## 2016-09-09 NOTE — Discharge Instructions (Signed)
Diet: As you were doing prior to hospitalization  ° °Shower:  May shower but keep the wounds dry, use an occlusive plastic wrap, NO SOAKING IN TUB.  If the bandage gets wet, change with a clean dry gauze. ° °Dressing:  You may change your dressing as needed. Change the dressing with sterile gauze dressing.   ° °Activity:  Increase activity slowly as tolerated, but follow the weight bearing instructions below.  No lifting or driving for 6 weeks. ° °Weight Bearing:   Non-weightbearing to the right shoulder. ° °To prevent constipation: you may use a stool softener such as - ° °Colace (over the counter) 100 mg by mouth twice a day  °Drink plenty of fluids (prune juice may be helpful) and high fiber foods °Miralax (over the counter) for constipation as needed.   ° °Itching:  If you experience itching with your medications, try taking only a single pain pill, or even half a pain pill at a time.  You may take up to 10 pain pills per day, and you can also use benadryl over the counter for itching or also to help with sleep.  ° °Precautions:  If you experience chest pain or shortness of breath - call 911 immediately for transfer to the hospital emergency department!! ° °If you develop a fever greater that 101 F, purulent drainage from wound, increased redness or drainage from wound, or calf pain-Call [facility]                                              °Follow- Up Appointment:  Please call for an appointment to be seen in 2 weeks at [facility] °

## 2016-09-09 NOTE — Evaluation (Signed)
Occupational Therapy Evaluation Patient Details Name: Samuel Ortiz MRN: [id number] DOB: 10/23/1934 Today's Date: 09/09/2016    History of Present Illness Pt is admitted for R TSR.    Clinical Impression   Pt. is an 82 y.o. Male who was admitted to [facility] for right TSR. Pt. presents with an immobilized dominant RUE, impaired RUE functioning, and decreased ROM which hinder her ability to complete ADL/self-care functioning. Pt. Could benefit from skilled OT services for ADL training, A/E training, and pt. Education about energy conservation, work simplification, and home modification/DME. Pt. Plans to return home with his wife upon discharge.    Follow Up Recommendations  No OT follow up    Equipment Recommendations       Recommendations for Other Services       Precautions / Restrictions Precautions Precautions: Fall;Shoulder Shoulder Interventions: Shoulder abduction pillow;Shoulder sling/immobilizer Precaution Booklet Issued: No Restrictions Weight Bearing Restrictions: Yes RUE Weight Bearing: Non weight bearing              ADL Overall ADL's : Needs assistance/impaired Eating/Feeding: Set up (Left hand)   Grooming: Set up (Left hand)          Upper Body Dressing : Maximal assistance   Lower Body Dressing: Minimal assistance                 General ADL Comments: Pt. uses his left hand to complete ADL tasks.     Vision     Perception     Praxis      Pertinent Vitals/Pain Pain Assessment: 0-10 (Simultaneous filing. User may not have seen previous data.) Pain Score: 8      Hand Dominance Right   Extremity/Trunk Assessment Upper Extremity Assessment Upper Extremity Assessment: Overall WFL for tasks assessed           Communication Communication Communication: No difficulties   Cognition Arousal/Alertness: Awake/alert Behavior During Therapy: WFL for tasks assessed/performed Overall Cognitive Status: Within Functional Limits for  tasks assessed                     General Comments       Exercises       Shoulder Instructions      Home Living Family/patient expects to be discharged to:: Private residence Living Arrangements: Spouse/significant other Available Help at Discharge: Family Type of Home: House Home Access: Stairs to enter Technical brewer of Steps: 4 Entrance Stairs-Rails: Left Home Layout: One level     Bathroom Shower/Tub: Teacher, early years/pre: Standard Bathroom Accessibility: Yes   Home Equipment: Cane - single point;Wheelchair - manual          Prior Functioning/Environment Level of Independence: Independent                 OT Problem List: Decreased strength;Decreased range of motion;Pain;Impaired UE functional use;Decreased knowledge of use of DME or AE;Decreased knowledge of precautions   OT Treatment/Interventions: Self-care/ADL training;Therapeutic exercise;Therapeutic activities;Energy conservation;DME and/or AE instruction;Patient/family education    OT Goals(Current goals can be found in the care plan section) Acute Rehab OT Goals Patient Stated Goal: To return home OT Goal Formulation: With patient Potential to Achieve Goals: Good  OT Frequency: Min 1X/week   Barriers to D/C:            Co-evaluation              End of Session    Activity Tolerance: Patient tolerated treatment well Patient left: in bed;with  call bell/phone within reach;with bed alarm set   Time: [phone] OT Time Calculation (min): 24 min Charges:  OT General Charges $OT Visit: 1 Procedure OT Evaluation $OT Eval Moderate Complexity: 1 Procedure G-Codes:    Harrel Carina, MS, OTR/L 09/09/2016, 10:46 AM

## 2016-09-09 NOTE — Progress Notes (Signed)
  Subjective: 1 Day Post-Op Procedure(s) (LRB): TOTAL SHOULDER ARTHROPLASTY (Right) Patient reports pain as 0 on 0-10 scale.   Patient is well, and has had no acute complaints or problems Plan is to go Home after hospital stay. Negative for chest pain and shortness of breath Fever: no Gastrointestinal:Negative for nausea and vomiting  Objective: Vital signs in last 24 hours: Temp:  [96.8 F (36 C)-98.1 F (36.7 C)] 97.8 F (36.6 C) (11/15 0737) Pulse Rate:  [56-80] 56 (11/15 0737) Resp:  [12-19] 18 (11/15 0737) BP: (100-165)/(57-99) 130/99 (11/15 0737) SpO2:  [95 %-100 %] 98 % (11/15 0737) FiO2 (%):  [21 %] 21 % (11/14 1446) Weight:  [95.3 kg (210 lb)-95.3 kg (210 lb 1.6 oz)] 95.3 kg (210 lb 1.6 oz) (11/14 1645)  Intake/Output from previous day:  Intake/Output Summary (Last 24 hours) at 09/09/16 0756 Last data filed at 09/09/16 0450  Gross per 24 hour  Intake          2973.33 ml  Output             2025 ml  Net           948.33 ml    Intake/Output this shift: No intake/output data recorded.  Labs:  Recent Labs  09/09/16 0359  HGB 11.4*    Recent Labs  09/09/16 0359  WBC 10.3  RBC 4.09*  HCT 34.3*  PLT 195    Recent Labs  09/09/16 0359  NA 138  K 4.6  CL 107  CO2 26  BUN 24*  CREATININE 1.11  GLUCOSE 109*  CALCIUM 8.1*   No results for input(s): LABPT, INR in the last 72 hours.   EXAM General - Patient is Alert, Appropriate and Oriented Extremity - ABD soft Sensation intact distally Intact pulses distally Incision: dressing C/D/I No cellulitis present Dressing/Incision - clean, dry, no drainage Motor Function - intact, moving foot and toes well on exam. Patient is intact to light touch in the distribution of the right axillary nerve.  Past Medical History:  Diagnosis Date  . Arthritis   . Cancer ([place])   . Clostridium difficile infection   . Depression   . GERD (gastroesophageal reflux disease)   . Hypercholesterolemia   .  Hypertension   . Hypothyroidism   . Postherpetic neuralgia   . Shingles   . Vitamin B 12 deficiency     Assessment/Plan: 1 Day Post-Op Procedure(s) (LRB): TOTAL SHOULDER ARTHROPLASTY (Right) Active Problems:   Status post total shoulder replacement, right  Estimated body mass index is 28.49 kg/m as calculated from the following:   Height as of this encounter: 6' (1.829 m).   Weight as of this encounter: 95.3 kg (210 lb 1.6 oz). [facility] diet Up with therapy   Plan will be for discharge home today following morning session of PT. Foley has been removed. Labs reviewed this AM. IVF to be removed and po diet advanced.  DVT Prophylaxis - Lovenox, Foot Pumps and TED hose Non-weightbearing to the right arm.  Samuel Isma Tietje, PA-C [facility] Orthopaedic Surgery 09/09/2016, 7:56 AM

## 2016-09-09 NOTE — Care Management Note (Signed)
Case Management Note  Patient Details  Name: Samuel Ortiz MRN: [id number] Date of Birth: 20-Dec-1933  Subjective/Objective:  POD # 1   Right TSA. Met with patient at bedside. He lives at home with his wife who is his caretaker. He has a cane. PT recommending home health and patient is agreeable. He has no agency preference. Referral to [facility]. PCP is Dr. Kandice Robinsons.            Action/Plan: Discharging today. [facility] PT. Notified of discharge.   Expected Discharge Date:    09/09/2016             Expected Discharge Plan:  [place]  In-House Referral:     Discharge planning Services  CM Consult  Post Acute Care Choice:  Home Health Choice offered to:  Patient  DME Arranged:    DME Agency:     HH Arranged:  PT [place]:  [place]  Status of Service:  In process, will continue to follow  If discussed at Long Length of Stay Meetings, dates discussed:    Additional Comments:  Jolly Mango, RN 09/09/2016, 9:07 AM

## 2016-09-09 NOTE — Progress Notes (Signed)
Foley d/c'd at [phone]

## 2016-09-09 NOTE — Progress Notes (Signed)
Pt. Shivering in pain. C/O 10 out of 10. No IV meds ordered. Dr. Marry Guan ordered demerol 25mg  iv and phenergan 12.5mg  IV once. Order demerol 12.5mg  iv q4h prn

## 2016-09-09 NOTE — Care Management Important Message (Signed)
Important Message  Patient Details  Name: CHIDOZIE BREGENZER MRN: [id number] Date of Birth: 1933-11-22   Medicare Important Message Given:  Yes    Jolly Mango, RN 09/09/2016, 8:47 AM

## 2016-09-10 LAB — SURGICAL PATHOLOGY

## 2018-06-21 IMAGING — MR MR SHOULDER*R* W/O CM
5 series · 40 of 40 positions shown · non-contrast
Comparison: None.

CLINICAL DATA: Osteoarthritis of the right shoulder. Decreased
range of motion.

EXAM:
MRI OF THE RIGHT SHOULDER WITHOUT CONTRAST
TECHNIQUE: Multiplanar, multisequence MR imaging of the shoulder was performed.
No intravenous contrast was administered.

[Series 3: T2 fat-sat · axial · 4.0mm · 0.55mm/px · z∈[-34,+66]mm · 8 of 24 slices shown (1 of 3)]
[im 1/24]
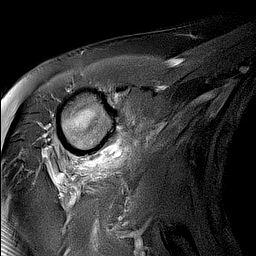
[im 4/24]
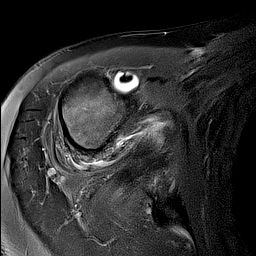
[im 7/24]
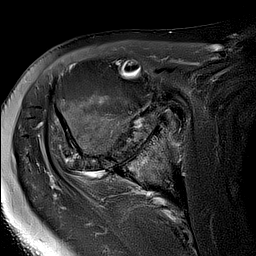
[im 10/24]
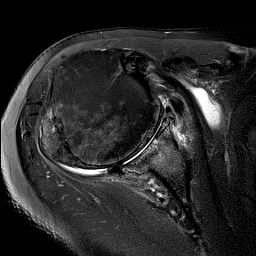
[im 14/24]
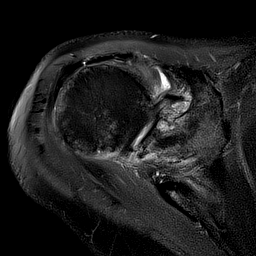
[im 17/24]
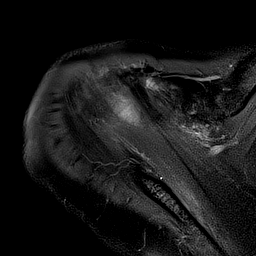
[im 20/24]
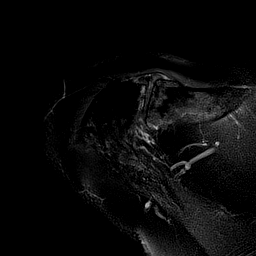
[im 24/24]
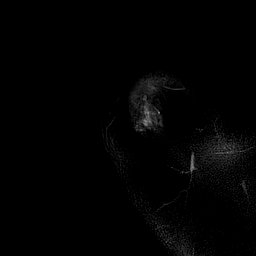

[Series 4: T2 fat-sat · oblique · 4.0mm · 0.66mm/px · 8 of 23 slices shown (2 of 3)]
[im 1/23]
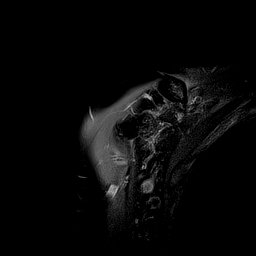
[im 4/23]
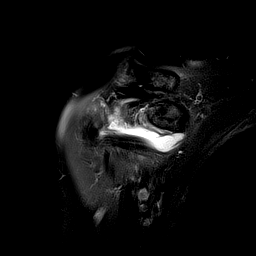
[im 7/23]
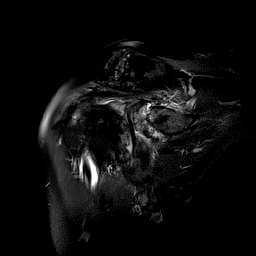
[im 10/23]
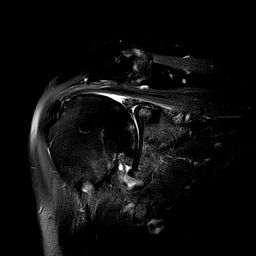
[im 13/23]
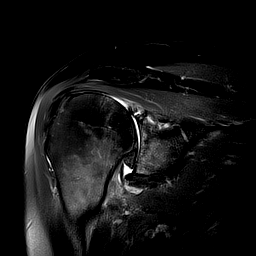
[im 16/23]
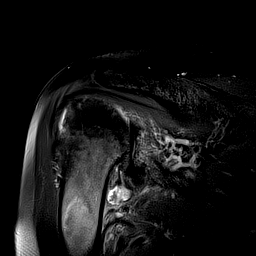
[im 19/23]
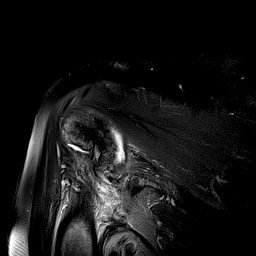
[im 23/23]
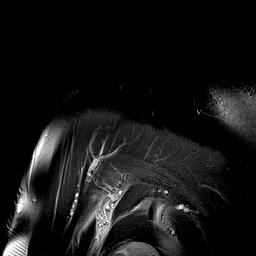

[Series 5: PD · oblique · 4.0mm · 0.66mm/px · 8 of 23 slices shown]
[im 1/23]
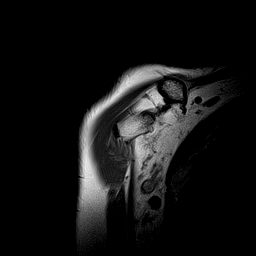
[im 4/23]
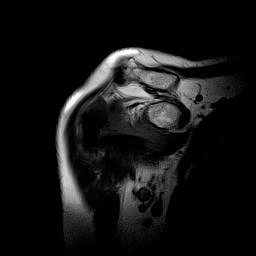
[im 7/23]
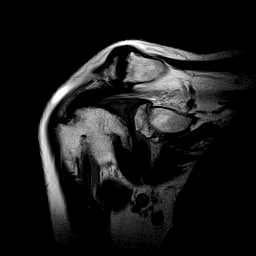
[im 10/23]
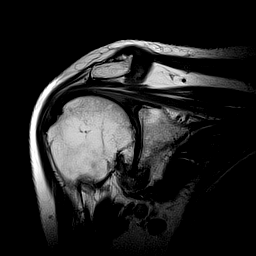
[im 13/23]
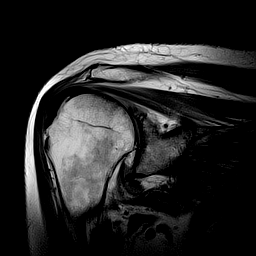
[im 16/23]
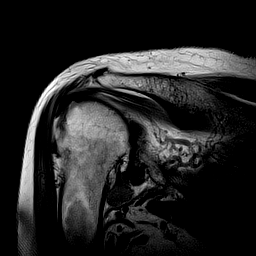
[im 19/23]
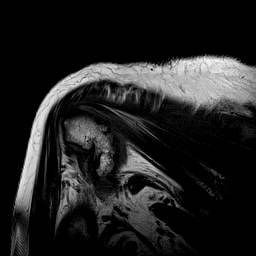
[im 23/23]
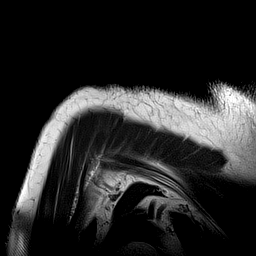

[Series 6: T1 · oblique · 4.0mm · 0.59mm/px · 8 of 23 slices shown]
[im 1/23]
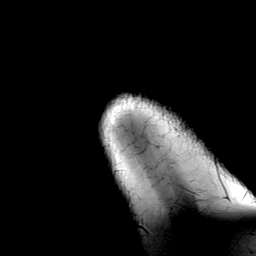
[im 4/23]
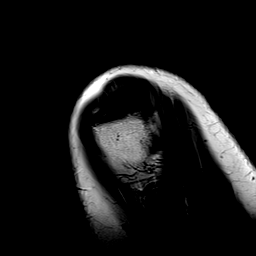
[im 7/23]
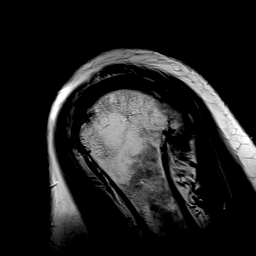
[im 10/23]
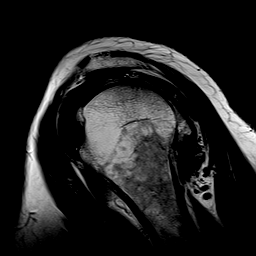
[im 13/23]
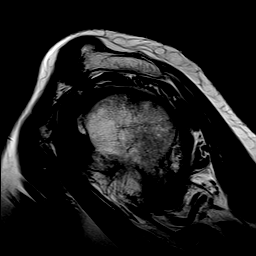
[im 16/23]
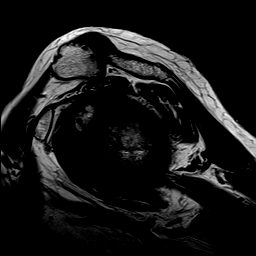
[im 19/23]
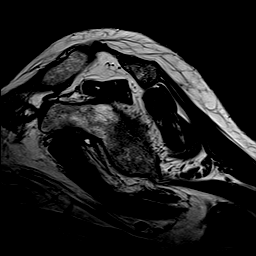
[im 23/23]
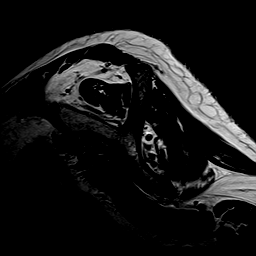

[Series 7: T2 fat-sat · oblique · 4.0mm · 0.59mm/px · 8 of 23 slices shown (3 of 3)]
[im 1/23]
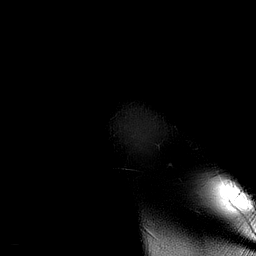
[im 4/23]
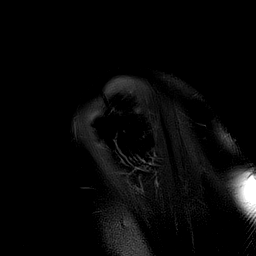
[im 7/23]
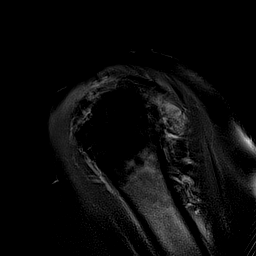
[im 10/23]
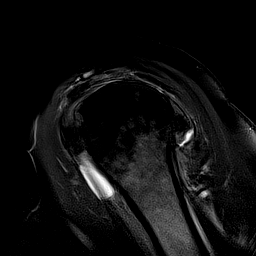
[im 13/23]
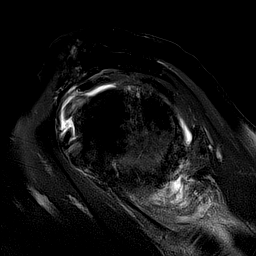
[im 16/23]
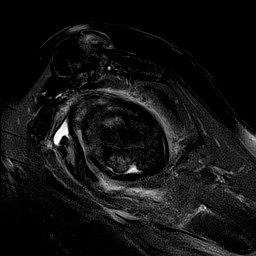
[im 19/23]
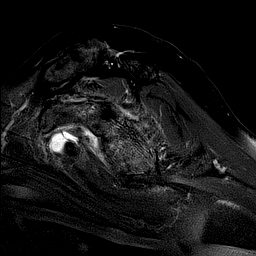
[im 23/23]
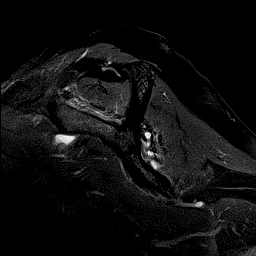

[40 of 40 positions shown; findings below may reference images not displayed]

FINDINGS: Rotator cuff: Mild tendinosis of the supraspinatus and infraspinatus
tendons. Teres minor tendon is intact. Mild tendinosis of the
subscapularis tendon.

Muscles: No atrophy or fatty replacement of nor abnormal signal
within, the muscles of the rotator cuff.

Biceps long head: Severe tendinosis of the intraarticular portion of
the long of the biceps tendon.

Acromioclavicular Joint: Severe degenerative changes of the
acromioclavicular joint. Type I acromion. No significant
subacromial/subdeltoid bursal fluid.

Glenohumeral Joint: Small joint effusion with synovitis. Extensive
full-thickness cartilage loss of the humeral head and glenoid with
subchondral reactive marrow changes more severe in the glenoid.

Labrum:  Diffuse labral degeneration.

Bones: No other marrow signal abnormality. No acute fracture or
dislocation.

Other: No fluid collection or hematoma.
IMPRESSION: 1. Severe osteoarthritis of the right glenohumeral joint.
2. Severe tendinosis of the intraarticular portion of the long head
of the biceps tendon.
3. Mild tendinosis of the supraspinatus, infraspinatus and
subscapularis tendons.
4. Small joint effusion with synovitis.
5. Severe degenerative changes of the acromioclavicular joint.

## 2018-11-17 ENCOUNTER — Inpatient Hospital Stay
Admission: EM | Admit: 2018-11-17 | Discharge: 2018-11-19 | DRG: 871 | Disposition: A | Discharge status: Home Health | Payer: Medicare Other | Attending: Internal Medicine | Admitting: Internal Medicine

## 2018-11-17 ENCOUNTER — Other Ambulatory Visit: Payer: Self-pay

## 2018-11-17 ENCOUNTER — Encounter: Payer: Self-pay | Admitting: Emergency Medicine

## 2018-11-17 ENCOUNTER — Emergency Department: Payer: Medicare Other

## 2018-11-17 DIAGNOSIS — N289 Disorder of kidney and ureter, unspecified: Secondary | ICD-10-CM | POA: Diagnosis present

## 2018-11-17 DIAGNOSIS — K219 Gastro-esophageal reflux disease without esophagitis: Secondary | ICD-10-CM | POA: Diagnosis present

## 2018-11-17 DIAGNOSIS — Z23 Encounter for immunization: Secondary | ICD-10-CM | POA: Diagnosis not present

## 2018-11-17 DIAGNOSIS — I10 Essential (primary) hypertension: Secondary | ICD-10-CM | POA: Diagnosis present

## 2018-11-17 DIAGNOSIS — J181 Lobar pneumonia, unspecified organism: Secondary | ICD-10-CM | POA: Diagnosis present

## 2018-11-17 DIAGNOSIS — I959 Hypotension, unspecified: Secondary | ICD-10-CM | POA: Diagnosis present

## 2018-11-17 DIAGNOSIS — Z79899 Other long term (current) drug therapy: Secondary | ICD-10-CM

## 2018-11-17 DIAGNOSIS — Z79891 Long term (current) use of opiate analgesic: Secondary | ICD-10-CM | POA: Diagnosis not present

## 2018-11-17 DIAGNOSIS — B0229 Other postherpetic nervous system involvement: Secondary | ICD-10-CM | POA: Diagnosis present

## 2018-11-17 DIAGNOSIS — Z87891 Personal history of nicotine dependence: Secondary | ICD-10-CM | POA: Diagnosis not present

## 2018-11-17 DIAGNOSIS — Z951 Presence of aortocoronary bypass graft: Secondary | ICD-10-CM

## 2018-11-17 DIAGNOSIS — E7800 Pure hypercholesterolemia, unspecified: Secondary | ICD-10-CM | POA: Diagnosis present

## 2018-11-17 DIAGNOSIS — E875 Hyperkalemia: Secondary | ICD-10-CM | POA: Diagnosis present

## 2018-11-17 DIAGNOSIS — Z96611 Presence of right artificial shoulder joint: Secondary | ICD-10-CM | POA: Diagnosis present

## 2018-11-17 DIAGNOSIS — A419 Sepsis, unspecified organism: Secondary | ICD-10-CM | POA: Diagnosis not present

## 2018-11-17 DIAGNOSIS — Z7951 Long term (current) use of inhaled steroids: Secondary | ICD-10-CM | POA: Diagnosis not present

## 2018-11-17 DIAGNOSIS — E785 Hyperlipidemia, unspecified: Secondary | ICD-10-CM | POA: Diagnosis present

## 2018-11-17 DIAGNOSIS — Z885 Allergy status to narcotic agent status: Secondary | ICD-10-CM | POA: Diagnosis not present

## 2018-11-17 DIAGNOSIS — Z85819 Personal history of malignant neoplasm of unspecified site of lip, oral cavity, and pharynx: Secondary | ICD-10-CM

## 2018-11-17 DIAGNOSIS — Z66 Do not resuscitate: Secondary | ICD-10-CM | POA: Diagnosis present

## 2018-11-17 DIAGNOSIS — Z7982 Long term (current) use of aspirin: Secondary | ICD-10-CM

## 2018-11-17 DIAGNOSIS — Z8589 Personal history of malignant neoplasm of other organs and systems: Secondary | ICD-10-CM

## 2018-11-17 DIAGNOSIS — J189 Pneumonia, unspecified organism: Secondary | ICD-10-CM

## 2018-11-17 DIAGNOSIS — E039 Hypothyroidism, unspecified: Secondary | ICD-10-CM | POA: Diagnosis present

## 2018-11-17 LAB — COMPREHENSIVE METABOLIC PANEL
ALT: 8 U/L (ref 0–44)
AST: 11 U/L — ABNORMAL LOW (ref 15–41)
Albumin: 3.1 g/dL — ABNORMAL LOW (ref 3.5–5.0)
Alkaline Phosphatase: 17 U/L — ABNORMAL LOW (ref 38–126)
Anion gap: 5 (ref 5–15)
BUN: 66 mg/dL — ABNORMAL HIGH (ref 8–23)
CO2: 25 mmol/L (ref 22–32)
Calcium: 7.7 mg/dL — ABNORMAL LOW (ref 8.9–10.3)
Chloride: 107 mmol/L (ref 98–111)
Creatinine, Ser: 1.72 mg/dL — ABNORMAL HIGH (ref 0.61–1.24)
GFR calc non Af Amer: 36 mL/min — ABNORMAL LOW (ref >60)
GFR, EST AFRICAN AMERICAN: 42 mL/min — AB (ref >60)
Glucose, Bld: 106 mg/dL — ABNORMAL HIGH (ref 70–99)
Potassium: 5.2 mmol/L — ABNORMAL HIGH (ref 3.5–5.1)
Sodium: 137 mmol/L (ref 135–145)
Total Bilirubin: 0.6 mg/dL (ref 0.3–1.2)
Total Protein: 6.6 g/dL (ref 6.5–8.1)

## 2018-11-17 LAB — URINALYSIS, COMPLETE (UACMP) WITH MICROSCOPIC
Bacteria, UA: NONE SEEN
Bilirubin Urine: NEGATIVE
GLUCOSE, UA: NEGATIVE mg/dL
Hgb urine dipstick: NEGATIVE
Ketones, ur: NEGATIVE mg/dL
Leukocytes, UA: NEGATIVE
Nitrite: NEGATIVE
Protein, ur: NEGATIVE mg/dL
Specific Gravity, Urine: 1.016 (ref 1.005–1.030)
pH: 5 (ref 5.0–8.0)

## 2018-11-17 LAB — CBC
HCT: 33.7 % — ABNORMAL LOW (ref 39.0–52.0)
Hemoglobin: 10.5 g/dL — ABNORMAL LOW (ref 13.0–17.0)
MCH: 28.2 pg (ref 26.0–34.0)
MCHC: 31.2 g/dL (ref 30.0–36.0)
MCV: 90.6 fL (ref 80.0–100.0)
Platelets: 252 10*3/uL (ref 150–400)
RBC: 3.72 MIL/uL — ABNORMAL LOW (ref 4.22–5.81)
RDW: 12.9 % (ref 11.5–15.5)
WBC: 21.2 10*3/uL — ABNORMAL HIGH (ref 4.0–10.5)
nRBC: 0 % (ref 0.0–0.2)

## 2018-11-17 LAB — INFLUENZA PANEL BY PCR (TYPE A & B)
Influenza A By PCR: NEGATIVE
Influenza B By PCR: NEGATIVE

## 2018-11-17 LAB — LACTIC ACID, PLASMA
Lactic Acid, Venous: 0.8 mmol/L (ref 0.5–1.9)
Lactic Acid, Venous: 0.9 mmol/L (ref 0.5–1.9)

## 2018-11-17 LAB — AMMONIA: AMMONIA: 14 umol/L (ref 9–35)

## 2018-11-17 LAB — GLUCOSE, CAPILLARY: Glucose-Capillary: 87 mg/dL (ref 70–99)

## 2018-11-17 LAB — TSH: TSH: 3.198 u[IU]/mL (ref 0.350–4.500)

## 2018-11-17 MED ORDER — ORAL CARE MOUTH RINSE
15.0000 mL | Freq: Two times a day (BID) | OROMUCOSAL | Status: DC
  Administered 2018-11-17 – 2018-11-18 (×3): 15 mL via OROMUCOSAL

## 2018-11-17 MED ORDER — SODIUM CHLORIDE 0.9 % IV BOLUS
500.0000 mL | Freq: Once | INTRAVENOUS | Status: AC
  Administered 2018-11-17: 500 mL via INTRAVENOUS

## 2018-11-17 MED ORDER — SODIUM CHLORIDE 0.9 % IV BOLUS
1000.0000 mL | Freq: Once | INTRAVENOUS | Status: AC
  Administered 2018-11-17: 1000 mL via INTRAVENOUS

## 2018-11-17 MED ORDER — POLYETHYLENE GLYCOL 3350 17 G PO PACK: 17.0000 g | PACK | Freq: Every day | ORAL | Status: DC | PRN

## 2018-11-17 MED ORDER — ACETAMINOPHEN 325 MG PO TABS: 650.0000 mg | ORAL_TABLET | Freq: Four times a day (QID) | ORAL | Status: DC | PRN

## 2018-11-17 MED ORDER — ENOXAPARIN SODIUM 40 MG/0.4ML ~~LOC~~ SOLN
40.0000 mg | SUBCUTANEOUS | Status: DC
  Administered 2018-11-17 – 2018-11-18 (×2): 40 mg via SUBCUTANEOUS
  Filled 2018-11-17 (×2): qty 0.4

## 2018-11-17 MED ORDER — SODIUM CHLORIDE 0.9 % IV SOLN
INTRAVENOUS | Status: DC
  Administered 2018-11-17 – 2018-11-18 (×2): via INTRAVENOUS

## 2018-11-17 MED ORDER — INFLUENZA VAC SPLIT HIGH-DOSE 0.5 ML IM SUSY
0.5000 mL | PREFILLED_SYRINGE | INTRAMUSCULAR | Status: AC
  Administered 2018-11-18: 0.5 mL via INTRAMUSCULAR
  Filled 2018-11-17: qty 0.5

## 2018-11-17 MED ORDER — ONDANSETRON HCL 4 MG PO TABS: 4.0000 mg | ORAL_TABLET | Freq: Four times a day (QID) | ORAL | Status: DC | PRN

## 2018-11-17 MED ORDER — PNEUMOCOCCAL VAC POLYVALENT 25 MCG/0.5ML IJ INJ
0.5000 mL | INJECTION | INTRAMUSCULAR | Status: AC
  Administered 2018-11-18: 0.5 mL via INTRAMUSCULAR
  Filled 2018-11-17: qty 0.5

## 2018-11-17 MED ORDER — GUAIFENESIN ER 600 MG PO TB12: 600.0000 mg | ORAL_TABLET | Freq: Two times a day (BID) | ORAL | Status: DC | PRN

## 2018-11-17 MED ORDER — OXYCODONE HCL 5 MG PO TABS
5.0000 mg | ORAL_TABLET | ORAL | Status: DC | PRN
  Administered 2018-11-17 – 2018-11-19 (×6): 5 mg via ORAL
  Filled 2018-11-17 (×2): qty 1
  Filled 2018-11-17: qty 2
  Filled 2018-11-17 (×3): qty 1

## 2018-11-17 MED ORDER — ONDANSETRON HCL 4 MG/2ML IJ SOLN: 4.0000 mg | Freq: Four times a day (QID) | INTRAMUSCULAR | Status: DC | PRN

## 2018-11-17 MED ORDER — ENOXAPARIN SODIUM 30 MG/0.3ML ~~LOC~~ SOLN: 30.0000 mg | SUBCUTANEOUS | Status: DC

## 2018-11-17 MED ORDER — SODIUM CHLORIDE 0.9 % IV SOLN
1.0000 g | INTRAVENOUS | Status: DC
  Administered 2018-11-18 – 2018-11-19 (×2): 1 g via INTRAVENOUS
  Filled 2018-11-17 (×2): qty 1

## 2018-11-17 MED ORDER — ALBUTEROL SULFATE (2.5 MG/3ML) 0.083% IN NEBU: 2.5000 mg | INHALATION_SOLUTION | RESPIRATORY_TRACT | Status: DC | PRN

## 2018-11-17 MED ORDER — ACETAMINOPHEN 650 MG RE SUPP: 650.0000 mg | Freq: Four times a day (QID) | RECTAL | Status: DC | PRN

## 2018-11-17 MED ORDER — AZITHROMYCIN 500 MG PO TABS
250.0000 mg | ORAL_TABLET | Freq: Every day | ORAL | Status: DC
  Administered 2018-11-18 – 2018-11-19 (×2): 250 mg via ORAL
  Filled 2018-11-17 (×2): qty 1

## 2018-11-17 MED ORDER — SODIUM CHLORIDE 0.9 % IV SOLN
1.0000 g | Freq: Once | INTRAVENOUS | Status: AC
  Administered 2018-11-17: 1 g via INTRAVENOUS
  Filled 2018-11-17: qty 10

## 2018-11-17 MED ORDER — SODIUM CHLORIDE 0.9 % IV SOLN
500.0000 mg | Freq: Once | INTRAVENOUS | Status: AC
  Administered 2018-11-17: 500 mg via INTRAVENOUS
  Filled 2018-11-17: qty 500

## 2018-11-17 NOTE — H&P (Signed)
[place] [facility] [place] NAME: Samuel Ortiz    MR#:  [id number]  DATE OF BIRTH:  1934/04/27  DATE OF ADMISSION:  11/17/2018  PRIMARY CARE PHYSICIAN: Sherrin Daisy, MD   REQUESTING/REFERRING PHYSICIAN: Dr. Mariea Clonts  CHIEF COMPLAINT:  generalized weakness. Could not get up out of bed today. Patient is accompanied by wife in the ER. Patient is not the best historian.   HISTORY OF PRESENT ILLNESS:  Samuel Ortiz  is a 85 y.o. 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 Patient felt very weak. He was not able to urinate in the urinal according to the wife and was weak thereby came to the emergency room. He was found to be hypotensive received IV fluids blood pressure is 96/62 during my evaluation. Patient denies any cough or fever. In the ER he was found to have elevated white count of 21.2. Chest x-ray showed left lower lobe infiltrate. Patient received IV Rocephin IV Zithromax and IV fluids. He is currently hemodynamically stable being admitted for community acquired pneumonia   PAST MEDICAL HISTORY:   Past Medical History:  Diagnosis Date  . Arthritis   . Cancer ([place])   . Clostridium difficile infection   . Depression   . GERD (gastroesophageal reflux disease)   . Hypercholesterolemia   . Hypertension   . Hypothyroidism   . Postherpetic neuralgia   . Shingles   . Vitamin B 12 deficiency     PAST SURGICAL HISTOIRY:   Past Surgical History:  Procedure Laterality Date  . CORONARY ARTERY BYPASS GRAFT    . FECAL TRANSPLANT    . FRACTURE SURGERY    . orif femur fraction Right   . throat cancer     resection of cancer  . TONSILLECTOMY    . TOTAL SHOULDER ARTHROPLASTY Right 09/08/2016    Procedure: TOTAL SHOULDER ARTHROPLASTY;  Surgeon: Corky Mull, MD;  Location: [facility] ORS;  Service: Orthopedics;  Laterality: Right;    SOCIAL HISTORY:   Social History   Tobacco Use  . Smoking status: Former Smoker    Last attempt to quit: 03/19/1961    Years since quitting: 57.7  . Smokeless tobacco: Never Used  Substance Use Topics  . Alcohol use: No    FAMILY HISTORY:   Family History  Problem Relation Age of Onset  . Prostate cancer Neg Hx   . Bladder Cancer Neg Hx   . Kidney cancer Neg Hx     DRUG ALLERGIES:   Allergies  Allergen Reactions  . Morphine And Related Other (See Comments)    Reaction:  Hallucinations     REVIEW OF SYSTEMS:  Review of Systems  Constitutional: Negative for chills, fever and weight loss.  HENT: Negative for ear discharge, ear pain and nosebleeds.   Eyes: Negative for blurred vision, pain and discharge.  Respiratory: Negative for sputum production, shortness of breath, wheezing and stridor.   Cardiovascular: Negative for chest pain, palpitations, orthopnea and PND.  Gastrointestinal: Negative for abdominal pain, diarrhea, nausea and vomiting.  Genitourinary: Negative for frequency and urgency.  Musculoskeletal: Negative for back pain and joint pain.  Neurological: Positive for weakness. Negative for sensory change, speech change and focal weakness.  Psychiatric/Behavioral: Negative for depression and hallucinations. The patient is not nervous/anxious.  MEDICATIONS AT HOME:   Prior to Admission medications   Medication Sig Start Date End Date Taking? Authorizing Provider  albuterol (PROVENTIL HFA;VENTOLIN HFA) 108 (90 Base) MCG/ACT inhaler Inhale 2 puffs into the lungs every 6 (six) hours as needed for wheezing or shortness of breath.   Yes [provider]  aspirin EC 81 MG tablet Take 81 mg by mouth daily.   Yes [provider]  carvedilol (COREG) 3.125 MG tablet Take 3.125 mg by mouth 2 (two) times daily with a  meal. Reported on 04/07/2016   Yes [provider]  gabapentin (NEURONTIN) 300 MG capsule Take 900 mg by mouth 3 (three) times daily.    Yes [provider]  levothyroxine (SYNTHROID) 25 MCG tablet Take 25 mcg by mouth daily before breakfast.   Yes [provider]  levothyroxine (SYNTHROID, LEVOTHROID) 100 MCG tablet Take 200 mcg by mouth daily before breakfast.   Yes [provider]  oxyCODONE (OXY IR/ROXICODONE) 5 MG immediate release tablet Take 1-2 tablets (5-10 mg total) by mouth every 4 (four) hours as needed for breakthrough pain. 09/09/16  Yes Lattie Corns, PA-C  acidophilus (RISAQUAD) CAPS capsule Take 2 capsules by mouth daily. Patient not taking: Reported on 08/28/2016 04/09/16   Loletha Grayer, MD  Cyanocobalamin (VITAMIN B-12 PO) Take by mouth.    [provider]  docusate sodium (COLACE) 100 MG capsule Take 100 mg by mouth daily.    [provider]  ERGOCALCIFEROL PO Take by mouth.    [provider]  feeding supplement, ENSURE ENLIVE, (ENSURE ENLIVE) LIQD Take 237 mLs by mouth 3 (three) times daily between meals. Patient not taking: Reported on 08/04/2016 04/09/16   Loletha Grayer, MD  ferrous sulfate 325 (65 FE) MG tablet Take 325 mg by mouth.    [provider]  HYDROcodone-acetaminophen (NORCO/VICODIN) 5-325 MG tablet Take 1 tablet by mouth every 6 (six) hours as needed for moderate pain. Patient taking differently: Take 1 tablet by mouth every 8 (eight) hours as needed for moderate pain.  04/09/16   Loletha Grayer, MD  lisinopril (PRINIVIL,ZESTRIL) 10 MG tablet Take 10 mg by mouth daily.    [provider]  MELOXICAM PO Take by mouth.    [provider]  OMEPRAZOLE PO Take by mouth.    [provider]  pantoprazole (PROTONIX) 40 MG tablet Take 1 tablet (40 mg total) by mouth 2 (two) times daily. Patient not taking: Reported on 08/28/2016 04/09/16   Loletha Grayer, MD   sertraline (ZOLOFT) 100 MG tablet Take 100 mg by mouth daily.    [provider]  sertraline (ZOLOFT) 25 MG tablet Take 25 mg by mouth daily.    [provider]  simvastatin (ZOCOR) 20 MG tablet Take 20 mg by mouth at bedtime.     [provider]  vancomycin (VANCOCIN) 50 mg/mL oral solution Take 5 mLs (250 mg total) by mouth every 6 (six) hours. 10 more days then stop Patient not taking: Reported on 08/28/2016 04/09/16   Loletha Grayer, MD      VITAL SIGNS:  Blood pressure (!) 94/50, pulse (!) 55, temperature 98.4 F (36.9 C), resp. rate 19, height 6' (1.829 m), weight 93 kg, SpO2 95 %.  PHYSICAL EXAMINATION:  GENERAL:  85 y.o.-year-old patient lying in the bed with no acute distress. Overall appears week EYES: Pupils equal, round, reactive to light and accommodation. No scleral icterus. Extraocular muscles intact.  HEENT: Head atraumatic, normocephalic. Oropharynx and nasopharynx clear.  Eating to less dry oral mucosa NECK:  Supple, no jugular venous distention. No thyroid enlargement, no tenderness.  LUNGS: Normal breath sounds bilaterally, no wheezing, rales,rhonchi or crepitation. No use of accessory muscles of respiration.  CARDIOVASCULAR: S1, S2 normal. No murmurs, rubs, or gallops.  ABDOMEN: Soft, nontender, nondistended. Bowel sounds present. No organomegaly or mass.  EXTREMITIES: No pedal edema, cyanosis, or clubbing.  NEUROLOGIC: Cranial nerves II through XII are intact. Muscle strength 4 /5 in all extremities. Sensation intact. Gait not checked.  PSYCHIATRIC: The patient is alert and oriented x 3.  SKIN: No obvious rash, lesion, or ulcer.   LABORATORY PANEL:   CBC Recent Labs  Lab 11/17/18 1024  WBC 21.2*  HGB 10.5*  HCT 33.7*  PLT 252   ------------------------------------------------------------------------------------------------------------------  Chemistries  Recent Labs  Lab 11/17/18 1024  NA 137  K 5.2*  CL 107  CO2 25   GLUCOSE 106*  BUN 66*  CREATININE 1.72*  CALCIUM 7.7*  AST 11*  ALT 8  ALKPHOS 17*  BILITOT 0.6   ------------------------------------------------------------------------------------------------------------------  Cardiac Enzymes No results for input(s): TROPONINI in the last 168 hours. ------------------------------------------------------------------------------------------------------------------  RADIOLOGY:  Dg Chest 2 View  Result Date: 11/17/2018 CLINICAL DATA:  Weakness EXAM: CHEST - 2 VIEW COMPARISON:  04/03/2016 FINDINGS: Cardiac shadow is stable. Postsurgical changes are again seen. Aortic calcifications are again noted. Bilateral healed rib fractures are noted. The lungs are well aerated bilaterally. Interval clearing of previously seen right basilar infiltrate is seen. Patchy left basilar changes are now seen. No sizable effusion is noted. IMPRESSION: New left basilar infiltrate. Electronically Signed   By: Inez Catalina M.D.   On: 11/17/2018 10:27   Ct Head Wo Contrast  Result Date: 11/17/2018 CLINICAL DATA:  Weakness EXAM: CT HEAD WITHOUT CONTRAST TECHNIQUE: Contiguous axial images were obtained from the base of the skull through the vertex without intravenous contrast. COMPARISON:  None. FINDINGS: Brain: Generalized atrophic changes are noted. No findings to suggest acute hemorrhage, acute infarction or space-occupying mass lesion are seen. Vascular: No hyperdense vessel or unexpected calcification. Skull: Normal. Negative for fracture or focal lesion. Sinuses/Orbits: Orbits and their contents are within normal limits. Opacification of the left maxillary antrum is noted and appears chronic in nature. No acute sinus abnormality is seen. Other: None. IMPRESSION: Mild atrophic changes. Chronic changes in the left maxillary antrum. No other focal abnormality is noted. Electronically Signed   By: Inez Catalina M.D.   On: 11/17/2018 10:14    EKG:    IMPRESSION AND PLAN:    Samuel Ortiz  is a 85 y.o. 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 Patient felt very weak. He was not able to urinate in the urinal according to the wife and was weak thereby came to the emergency room.  1. Sepsis secondary to left lower lobe pneumonia -patient presented with weakness generalized along with hypotension and elevated white count with positive chest x-ray. Lactic acid still pending. -IV Rocephin and Zithromax -follow blood culture -follow-up CBC -IV fluids  2. Hypotension due to number one -continue IV fluids. Patient removed responding to IV fluids. Currently asymptomatic  3. History of postherpetic neuralgia with chronic narcotic dependence -holding off narcotics since blood pressure was bit on the lower side -resume home meds once medication list has  been brought in by wife  4. DVT prophylaxis subcu Lovenox  5. Mild renal insufficiency continue IV fluids  6. Leukocytosis due to number one  Code status DNR this was discussed with patient and wife.  Physical therapy, care management for discharge planning   All the records are reviewed and case discussed with ED provider.   CODE STATUS: DNR  TOTAL TIME TAKING CARE OF THIS PATIENT: *50* minutes.    Fritzi Mandes M.D on 11/17/2018 at 12:11 PM  Between 7am to 6pm - Pager - [phone]  After 6pm go to [email or web address] - password [email or web address] [facility]  [facility]  Office  [phone]  CC: Primary care physician; Sherrin Daisy, MD

## 2018-11-17 NOTE — Progress Notes (Signed)
CODE SEPSIS - PHARMACY COMMUNICATION  **Broad Spectrum Antibiotics should be administered within 1 hour of Sepsis diagnosis**  Time Code Sepsis Called/Page Received: 1100  Antibiotics Ordered: Azithromycin/Ceftriaxone   Time of 1st antibiotic administration: 1152 Ceftriaxone & Azithromycin   Additional action taken by pharmacy: pharmacy technician spoke with RN about antibiotics and 1 hr window. Had some issues obtaining blood cultures   If necessary, Name of Provider/Nurse Contacted: Rudene Re ,PharmD Clinical Pharmacist  11/17/2018  11:22 AM

## 2018-11-17 NOTE — ED Triage Notes (Signed)
Pt to ER from home with c/o generalized weakness.  Family states pt is usually ambulatory and today has been unable to sit up in bed on his own.

## 2018-11-17 NOTE — ED Notes (Signed)
ED TO INPATIENT HANDOFF REPORT  Name/Age/Gender Samuel Ortiz 85 y.o. male  Code Status Code Status History    Date Active Date Inactive Code Status Order ID Comments User Context   09/08/2016 1445 09/09/2016 1800 DNR [id number]  Corky Mull, MD Inpatient   04/03/2016 2222 04/09/2016 1827 DNR [id number]  Quintella Baton, MD Inpatient    Questions for Most Recent Historical Code Status (Order [id number])    Question Answer Comment   In the event of cardiac or respiratory ARREST Do not call a "code blue"    In the event of cardiac or respiratory ARREST Do not perform Intubation, CPR, defibrillation or ACLS    In the event of cardiac or respiratory ARREST Use medication by any route, position, wound care, and other measures to relive pain and suffering. May use oxygen, suction and manual treatment of airway obstruction as needed for comfort.       Home/SNF/Other Home  Chief Complaint weakness  Level of Care/Admitting Diagnosis ED Disposition    ED Disposition Condition [facility]: [facility] [[id number]]  Level of Care: Med-Surg [16]  Diagnosis: Sepsis [facility]) [[id number]]  Admitting Physician: Odessa Fleming  Attending Physician: Odessa Fleming  Estimated length of stay: past midnight tomorrow  Certification:: I certify this patient will need inpatient services for at least 2 midnights  PT Class (Do Not Modify): Inpatient [101]  PT Acc Code (Do Not Modify): Private [1]       Medical History Past Medical History:  Diagnosis Date  . Arthritis   . Cancer ([place])   . Clostridium difficile infection   . Depression   . GERD (gastroesophageal reflux disease)   . Hypercholesterolemia   . Hypertension   . Hypothyroidism   . Postherpetic neuralgia   . Shingles   . Vitamin B 12 deficiency     Allergies Allergies  Allergen Reactions  . Morphine And Related Other (See Comments)    Reaction:  Hallucinations     IV  Location/Drains/Wounds Patient Lines/Drains/Airways Status   Active Line/Drains/Airways    Name:   Placement date:   Placement time:   Site:   Days:   Peripheral IV 11/17/18 Left Forearm   11/17/18    0938    Forearm   less than 1   Peripheral IV 11/17/18 Right Antecubital   11/17/18    1145    Antecubital   less than 1   Incision (Closed) 09/08/16 Shoulder Right   09/08/16    1307     800          Labs/Imaging Results for orders placed or performed during the hospital encounter of 11/17/18 (from the past 48 hour(s))  Blood gas, venous     Status: None (Preliminary result)   Collection Time: 11/17/18  9:28 AM  Result Value Ref Range   pH, Ven 7.29 7.250 - 7.430   pCO2, Ven 54 44.0 - 60.0 mmHg   pO2, Ven PENDING 32.0 - 45.0 mmHg   Patient temperature 37.0    Collection site VENOUS    Sample type VENOUS     Comment: Performed at [facility], [address] [place], [place] [address]  Ammonia     Status: None   Collection Time: 11/17/18 10:24 AM  Result Value Ref Range   Ammonia 14 9 - 35 umol/L    Comment: Performed at [facility], [address] [place], [place] [address]  CBC  Status: Abnormal   Collection Time: 11/17/18 10:24 AM  Result Value Ref Range   WBC 21.2 (H) 4.0 - 10.5 K/uL   RBC 3.72 (L) 4.22 - 5.81 MIL/uL   Hemoglobin 10.5 (L) 13.0 - 17.0 g/dL   HCT 33.7 (L) 39.0 - 52.0 %   MCV 90.6 80.0 - 100.0 fL   MCH 28.2 26.0 - 34.0 pg   MCHC 31.2 30.0 - 36.0 g/dL   RDW 12.9 11.5 - 15.5 %   Platelets 252 150 - 400 K/uL   nRBC 0.0 0.0 - 0.2 %    Comment: Performed at [facility], [address] [place], [place] [address]  Comprehensive metabolic panel     Status: Abnormal   Collection Time: 11/17/18 10:24 AM  Result Value Ref Range   Sodium 137 135 - 145 mmol/L   Potassium 5.2 (H) 3.5 - 5.1 mmol/L   Chloride 107 98 - 111 mmol/L   CO2 25 22 - 32 mmol/L   Glucose, Bld 106 (H) 70 - 99 mg/dL   BUN 66 (H) 8 - 23 mg/dL   Creatinine,  Ser 1.72 (H) 0.61 - 1.24 mg/dL   Calcium 7.7 (L) 8.9 - 10.3 mg/dL   Total Protein 6.6 6.5 - 8.1 g/dL   Albumin 3.1 (L) 3.5 - 5.0 g/dL   AST 11 (L) 15 - 41 U/L   ALT 8 0 - 44 U/L   Alkaline Phosphatase 17 (L) 38 - 126 U/L   Total Bilirubin 0.6 0.3 - 1.2 mg/dL   GFR calc non Af Amer 36 (L) >60 mL/min   GFR calc Af Amer 42 (L) >60 mL/min   Anion gap 5 5 - 15    Comment: Performed at [facility], [address] [place], [place] [address]  TSH     Status: None   Collection Time: 11/17/18 10:24 AM  Result Value Ref Range   TSH 3.198 0.350 - 4.500 uIU/mL    Comment: Performed by a 3rd Generation assay with a functional sensitivity of <=0.01 uIU/mL. Performed at [facility], [address] [place],  [address]   Glucose, capillary     Status: None   Collection Time: 11/17/18 12:09 PM  Result Value Ref Range   Glucose-Capillary 87 70 - 99 mg/dL   Dg Chest 2 View  Result Date: 11/17/2018 CLINICAL DATA:  Weakness EXAM: CHEST - 2 VIEW COMPARISON:  04/03/2016 FINDINGS: Cardiac shadow is stable. Postsurgical changes are again seen. Aortic calcifications are again noted. Bilateral healed rib fractures are noted. The lungs are well aerated bilaterally. Interval clearing of previously seen right basilar infiltrate is seen. Patchy left basilar changes are now seen. No sizable effusion is noted. IMPRESSION: New left basilar infiltrate. Electronically Signed   By: Inez Catalina M.D.   On: 11/17/2018 10:27   Ct Head Wo Contrast  Result Date: 11/17/2018 CLINICAL DATA:  Weakness EXAM: CT HEAD WITHOUT CONTRAST TECHNIQUE: Contiguous axial images were obtained from the base of the skull through the vertex without intravenous contrast. COMPARISON:  None. FINDINGS: Brain: Generalized atrophic changes are noted. No findings to suggest acute hemorrhage, acute infarction or space-occupying mass lesion are seen. Vascular: No hyperdense vessel or unexpected calcification. Skull: Normal.  Negative for fracture or focal lesion. Sinuses/Orbits: Orbits and their contents are within normal limits. Opacification of the left maxillary antrum is noted and appears chronic in nature. No acute sinus abnormality is seen. Other: None. IMPRESSION: Mild atrophic changes. Chronic changes in the left  maxillary antrum. No other focal abnormality is noted. Electronically Signed   By: Inez Catalina M.D.   On: 11/17/2018 10:14    Pending Labs Unresulted Labs (From admission, onward)    Start     Ordered   11/17/18 1126  Influenza panel by PCR (type A & B)  (Influenza PCR Panel)  Once,   STAT     11/17/18 1125   11/17/18 1055  Lactic acid, plasma  Now then every 2 hours,   STAT     11/17/18 1054   11/17/18 1055  Blood Culture (routine x 2)  BLOOD CULTURE X 2,   STAT     11/17/18 1054   11/17/18 0916  Urinalysis, Complete w Microscopic  Once,   STAT     11/17/18 0915   Signed and Held  CBC  (enoxaparin (LOVENOX)    CrCl < 30 ml/min)  Once,   R    Comments:  Baseline for enoxaparin therapy IF NOT ALREADY DRAWN.  Notify MD if PLT < 100 K.    Signed and Held   Signed and Held  Creatinine, serum  (enoxaparin (LOVENOX)    CrCl < 30 ml/min)  Once,   R    Comments:  Baseline for enoxaparin therapy IF NOT ALREADY DRAWN.    Signed and Held   Signed and Held  Creatinine, serum  (enoxaparin (LOVENOX)    CrCl < 30 ml/min)  Weekly,   R    Comments:  while on enoxaparin therapy.    Signed and Held   Signed and Held  Basic metabolic panel  Tomorrow morning,   R     Signed and Held   Signed and Held  CBC  Tomorrow morning,   R     Signed and Held          Vitals/Pain Today's Vitals   11/17/18 1030 11/17/18 1100 11/17/18 1144 11/17/18 1201  BP: (!) 86/76 (!) 94/56 (!) 94/50 96/63  Pulse: (!) 55 (!) 55    Resp: 14 17 19 18   Temp:      SpO2: 100% 95%    Weight:      Height:      PainSc:        Isolation Precautions Droplet precaution  Medications Medications  azithromycin (ZITHROMAX) 500  mg in sodium chloride 0.9 % 250 mL IVPB (500 mg Intravenous New Bag/Given 11/17/18 1152)  cefTRIAXone (ROCEPHIN) 1 g in sodium chloride 0.9 % 100 mL IVPB (1 g Intravenous New Bag/Given 11/17/18 1152)  sodium chloride 0.9 % bolus 1,000 mL (1,000 mLs Intravenous New Bag/Given 11/17/18 1153)  sodium chloride 0.9 % bolus 1,000 mL (0 mLs Intravenous Stopped 11/17/18 1048)  sodium chloride 0.9 % bolus 500 mL (0 mLs Intravenous Stopped 11/17/18 1129)    Mobility Walks, currently nonambulatory due to weakness

## 2018-11-17 NOTE — Progress Notes (Signed)
PT Cancellation Note  Patient Details Name: Samuel Ortiz MRN: [id number] DOB: February 06, 1934   Cancelled Treatment:    Reason Eval/Treat Not Completed: Patient declined, no reason specified.  Order received and chart reviewed.  Pt eating his "second attempt at lunch" and requested that PT return later.  Will re-attempt later when time allows.  Roxanne Gates, PT, DPT  Roxanne Gates 11/17/2018, 2:33 PM

## 2018-11-17 NOTE — Progress Notes (Signed)
Family Meeting Note  Advance Directive:{yes  Today a meeting took place with the patient and wife patient came in with generalized weakness. Workup in the ER showed sepsis secondary to left lower lobe pneumonia. Patient has chronic multiple medical problems for which he follows at the [place]. Will be admitted for IV antibiotics IV fluids. Discuss code status with patient and he wishes to be DNR. Wife confirms it.   Time spent during discussion 16 minutes  Fritzi Mandes, MD

## 2018-11-17 NOTE — ED Provider Notes (Addendum)
[facility] Emergency Department Provider Note  ____________________________________________  Time seen: Approximately 9:16 AM  I have reviewed the triage vital signs and the nursing notes.   HISTORY  Chief Complaint Weakness    HPI Samuel Ortiz is a 85 y.o. 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  The patient lives independently and states that he is usually able to perform his own ADLs, using a cane only when outside of the home.  Today, the patient was unable to get out of bed and feels like both his arms "want to drop."  He denies any trauma, changes in medications, or illness including cough or cold symptoms, nausea vomiting or diarrhea.  He has not had any numbness or tingling, visual changes, speech changes or mental status changes.  He denies any dysuria or urinary frequency.  Past Medical History:  Diagnosis Date  . Arthritis   . Cancer ([place])   . Clostridium difficile infection   . Depression   . GERD (gastroesophageal reflux disease)   . Hypercholesterolemia   . Hypertension   . Hypothyroidism   . Postherpetic neuralgia   . Shingles   . Vitamin B 12 deficiency     Patient Active Problem List   Diagnosis Date Noted  . Status post total shoulder replacement, right 09/08/2016  . AKI (acute kidney injury) ([place]) 07/22/2016  . 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 Acute pain of right shoulder 06/02/2016  . Primary osteoarthritis of right shoulder 06/02/2016  . Hypotension 04/03/2016  . Clostridium difficile diarrhea 04/03/2016  . GI bleed 04/03/2016  . Dysphagia 04/03/2016  . Syncope 04/03/2016  . Aspiration pneumonia ([place]) 04/03/2016  . Shingles 04/03/2016  . Post herpetic neuralgia 04/03/2016  . Elevated troponin 04/03/2016  . Hypothyroidism 04/03/2016  . Physical deconditioning 04/03/2016  . History of throat cancer  04/03/2016  . Pneumonia of right lower lobe due to infectious organism ([place]) 03/25/2016  . Severe sepsis ([place]) 03/25/2016  . History of esophageal stricture 03/22/2016  . Community acquired pneumonia 03/21/2016  . Hypertension 03/21/2016  . Sepsis ([place]) 03/21/2016  . COPD, mild ([place]) 09/17/2014  . Back muscle spasm 08/22/2014  . DDD (degenerative disc disease), lumbar 08/21/2014  . Depression 07/19/2014  . Essential hypertension 07/19/2014  . Hyperlipidemia, unspecified 07/19/2014    Past Surgical History:  Procedure Laterality Date  . CORONARY ARTERY BYPASS GRAFT    . FECAL TRANSPLANT    . FRACTURE SURGERY    . orif femur fraction Right   . throat cancer     resection of cancer  . TONSILLECTOMY    . TOTAL SHOULDER ARTHROPLASTY Right 09/08/2016   Procedure: TOTAL SHOULDER ARTHROPLASTY;  Surgeon: Corky Mull, MD;  Location: [facility] ORS;  Service: Orthopedics;  Laterality: Right;    Current Outpatient Rx  . Order #: [id number] Class: Historical Med  . Order #: [id number] Class: Historical Med  . Order #: [id number] Class: Historical Med  . Order #: [id number] Class: Historical Med  . Order #: [id number] Class: Historical Med  . Order #: [id number] Class: Historical Med  . Order #: [id number] Class: Print  . Order #: [id number] Class: Print  . Order #: [id number] Class: Historical Med  . Order #: [id number] Class: Historical Med  . Order #: [id number] Class: Historical Med  . Order #: [id number] Class: Print  . Order #: [id number] Class: Historical Med  . Order #: [id number] Class: Print  . Order #: [id number] Class: Historical Med  .  Order #: [id number] Class: Historical Med  . Order #: [id number] Class: Historical Med  . Order #: [id number] Class: Print  . Order #: [id number] Class: Historical Med  . Order #: [id number] Class: Historical Med  . Order #: [id number] Class: Historical Med  . Order #: [id number] Class: Print    Allergies Morphine and related  Family History  Problem Relation Age of Onset  .  Prostate cancer Neg Hx   . Bladder Cancer Neg Hx   . Kidney cancer Neg Hx     Social History Social History   Tobacco Use  . Smoking status: Former Smoker    Last attempt to quit: 03/19/1961    Years since quitting: 57.7  . Smokeless tobacco: Never Used  Substance Use Topics  . Alcohol use: No  . Drug use: No    Review of Systems Constitutional: No fever/chills.  No lightheadedness or syncope.  Positive general weakness.  No trauma. Eyes: No visual changes.  No blurred or double vision. ENT: No sore throat. No congestion or rhinorrhea. Cardiovascular: Denies chest pain. Denies palpitations. Respiratory: Denies shortness of breath.  No cough. Gastrointestinal: No abdominal pain.  No nausea, no vomiting.  No diarrhea.  No constipation. Genitourinary: Negative for dysuria.  No urinary frequency. Musculoskeletal: Negative for back pain. Skin: Negative for rash.   Neurological: Negative for headaches. No focal numbness, tingling.  Generalized weakness with a sensation that his arms want to drop and inability to sit up properly or walk.  Postherpetic neuralgia pain which is unchanged.    ____________________________________________   PHYSICAL EXAM:  VITAL SIGNS: ED Triage Vitals [11/17/18 0907]  Enc Vitals Group     BP      Pulse      Resp      Temp      Temp src      SpO2      Weight 205 lb (93 kg)     Height 6' (1.829 m)     Head Circumference      Peak Flow      Pain Score 5     Pain Loc      Pain Edu?      Excl. in [place]?     Constitutional: Alert and oriented.Answers questions appropriately.  Chronically ill-appearing. Eyes: Conjunctivae are normal.  EOMI. PERRLA.  No scleral icterus. Head: Atraumatic. Nose: No congestion/rhinnorhea. Mouth/Throat: Mucous membranes are mildly dry..  Neck: No stridor.  Supple.  No JVD. Cardiovascular: Normal rate, regular rhythm. No murmurs, rubs or gallops.  Respiratory: Normal respiratory effort.  No accessory muscle use or  retractions. Lungs CTAB.  No wheezes, rales or ronchi. Gastrointestinal: Soft, nontender and nondistended.  No guarding or rebound.  No peritoneal signs. Musculoskeletal: No LE edema. No ttp in the calves or palpable cords.  Negative Homan's sign. Neurologic: Alert and oriented 3. Speech is clear. Face and smile symmetric. Tongue is midline.  EOMI.  PERRLA.  No pronator drift, but during examination, the patient has a sudden drop of the right arm but is able to get back up immediately. 5 out of 5 grip, biceps, triceps, hip flexors, plantar flexion and dorsiflexion. Normal sensation to light touch in the bilateral upper and lower extremities, and face. Skin:  Skin is warm, dry and intact. No rash noted. Psychiatric: Mood and affect are normal.  ____________________________________________   LABS (all labs ordered are listed, but only abnormal results are displayed)  Labs Reviewed  CBC - Abnormal; Notable for the following components:      Result  Value   WBC 21.2 (*)    RBC 3.72 (*)    Hemoglobin 10.5 (*)    HCT 33.7 (*)    All other components within normal limits  CULTURE, BLOOD (ROUTINE X 2)  CULTURE, BLOOD (ROUTINE X 2)  BLOOD GAS, VENOUS  AMMONIA  URINALYSIS, COMPLETE (UACMP) WITH MICROSCOPIC  COMPREHENSIVE METABOLIC PANEL  TSH  LACTIC ACID, PLASMA  LACTIC ACID, PLASMA  CBG MONITORING, ED   ____________________________________________  EKG  ED ECG REPORT I, Anne-Caroline Mariea Clonts, the attending physician, personally viewed and interpreted this ECG.   Date: 11/17/2018  EKG Time: 912  Rate: 68  Rhythm: normal sinus rhythm  Axis: normal  Intervals:1st degree block  ST&T Change: NO STEMI; qwaves III and AVF  ____________________________________________  RADIOLOGY  Dg Chest 2 View  Result Date: 11/17/2018 CLINICAL DATA:  Weakness EXAM: CHEST - 2 VIEW COMPARISON:  04/03/2016 FINDINGS: Cardiac shadow is stable. Postsurgical changes are again seen. Aortic calcifications  are again noted. Bilateral healed rib fractures are noted. The lungs are well aerated bilaterally. Interval clearing of previously seen right basilar infiltrate is seen. Patchy left basilar changes are now seen. No sizable effusion is noted. IMPRESSION: New left basilar infiltrate. Electronically Signed   By: Inez Catalina M.D.   On: 11/17/2018 10:27   Ct Head Wo Contrast  Result Date: 11/17/2018 CLINICAL DATA:  Weakness EXAM: CT HEAD WITHOUT CONTRAST TECHNIQUE: Contiguous axial images were obtained from the base of the skull through the vertex without intravenous contrast. COMPARISON:  None. FINDINGS: Brain: Generalized atrophic changes are noted. No findings to suggest acute hemorrhage, acute infarction or space-occupying mass lesion are seen. Vascular: No hyperdense vessel or unexpected calcification. Skull: Normal. Negative for fracture or focal lesion. Sinuses/Orbits: Orbits and their contents are within normal limits. Opacification of the left maxillary antrum is noted and appears chronic in nature. No acute sinus abnormality is seen. Other: None. IMPRESSION: Mild atrophic changes. Chronic changes in the left maxillary antrum. No other focal abnormality is noted. Electronically Signed   By: Inez Catalina M.D.   On: 11/17/2018 10:14    ____________________________________________   PROCEDURES  Procedure(s) performed: None  Procedures  Critical Care performed: Yes ____________________________________________   INITIAL IMPRESSION / ASSESSMENT AND PLAN / ED COURSE  Pertinent labs & imaging results that were available during my care of the patient were reviewed by me and considered in my medical decision making (see chart for details).  85 y.o. male who lives at home presenting with acute onset of generalized weakness.  Overall, the patient is hemodynamically stable.  He has not been having any signs or symptoms that would be suggestive of infection but we will check a UA for UTI.  The  patient will also have routine laboratory studies checked including electrolyte disturbances such as hyponatremia which could cause this.  A CBG has been ordered to rule out hypoglycemia.  The patient will undergo CT of the head for evaluation of an acute intracranial process, including mass, bleed or evidence of CVA.  The patient's EKG does not show any ischemia or arrhythmia.  I have ordered intravenous fluids given the mildly dry mucous membranes at the patient has and will reevaluate him for final disposition.  I anticipate he will likely need admission.  ----------------------------------------- 10:56 AM on 11/17/2018 -----------------------------------------  Patient does have a new left basilar infiltrate.  Since arriving, the patient was initially normotensive and has become hypotensive.  He also had room air sats of 88% which improved  with 2 L nasal cannula to the mid 90s.  He is responding to intravenous fluids and oxygen.  I have ordered a sepsis work-up, including blood cultures and empiric antibiotics for community-acquired pneumonia.  I am still awaiting the results of his UA.  His white blood cell count is elevated at 21.2.  His CT scan does not show any acute intracranial process.  The patient usually does get all of his care at the [facility], but he has multiple adjunct insurance carriers and prefers to be admitted here.  I have talked to the patient and his wife both about his clinical plan, and the plan to figure out where he will be admitted.  ----------------------------------------- 11:25 AM on 11/17/2018 -----------------------------------------  At this time, the patient's blood pressure is improving to 95/75 after intravenous fluids.  He continues to maintain oxygen saturations of 99% on 2 L nasal cannula.  I have admitted the patient to Dr. Posey Pronto, the hospitalist at this time.  CRITICAL CARE Performed by: Eula Listen   Total critical care time: 45  minutes  Critical care time was exclusive of separately billable procedures and treating other patients.  Critical care was necessary to treat or prevent imminent or life-threatening deterioration.  Critical care was time spent personally by me on the following activities: development of treatment plan with patient and/or surrogate as well as nursing, discussions with consultants, evaluation of patient's response to treatment, examination of patient, obtaining history from patient or surrogate, ordering and performing treatments and interventions, ordering and review of laboratory studies, ordering and review of radiographic studies, pulse oximetry and re-evaluation of patient's condition.    ____________________________________________  FINAL CLINICAL IMPRESSION(S) / ED DIAGNOSES  Final diagnoses:  Sepsis, due to unspecified organism, unspecified whether acute organ dysfunction present ([place])  Pneumonia of left lower lobe due to infectious organism [facility])         NEW MEDICATIONS STARTED DURING THIS VISIT:  New Prescriptions   No medications on file      Eula Listen, MD 11/17/18 1102    Eula Listen, MD 11/17/18 1102    Eula Listen, MD 11/17/18 1125

## 2018-11-17 NOTE — Progress Notes (Signed)
This RN scanned patient's bladder. Bladder scan resulted in 764mL. This RN notified the primary RN Gerald Stabs of the results. This RN with student nurse inserted in and out foley catheter per order and 757mL of urine came out via foley. Urine sample was obtained and sent to lab. Post cath bladder scan resulted in 50mL. Pt tolerated well.

## 2018-11-17 NOTE — Progress Notes (Addendum)
In and out cath performed per MD order 720 ml  out

## 2018-11-17 NOTE — Evaluation (Signed)
Physical Therapy Evaluation Patient Details Name: Samuel Ortiz MRN: [id number] DOB: October 11, 1934 Today's Date: 11/17/2018   History of Present Illness  Pt is an 85 year old male admitted with L lower lobe pneumonia following c/o weakness.  PMH includes shingles, hypthyroidism, HCL, depression, c. diff, CA and arthritis.  Clinical Impression  Pt in bed and alert but agitated during evaluation.  He agreed to try to sit up at EOB and was able to perform bed mobility mod I.  Pt able to sit at EOB without assistance.  He expressed sudden pain increase in neck with mobility in no specific direction.  Pt attempted STS from bedside but experienced sudden losses of wrist control in WB. He was too weak overall to stand without use of UE and transfer was deferred.  Pt will continue to benefit from skilled PT with focus on strength, tolerance to activity, pain management and safe functional mobility.  Due to decrease in mobility compared to baseline, pt will benefit from SNF placement following discharge.    Follow Up Recommendations SNF    Equipment Recommendations  Rolling walker with 5" wheels    Recommendations for Other Services       Precautions / Restrictions Precautions Precautions: Fall Restrictions Weight Bearing Restrictions: No      Mobility  Bed Mobility Overal bed mobility: Modified Independent             General bed mobility comments: Increased time  Transfers Overall transfer level: (Pt attempted STS but was unable due to poor UE control and overall weakness.)                  Ambulation/Gait                Stairs            Wheelchair Mobility    Modified Rankin (Stroke Patients Only)       Balance Overall balance assessment: Needs assistance Sitting-balance support: Feet supported;Bilateral upper extremity supported Sitting balance-Leahy Scale: Fair                                       Pertinent Vitals/Pain Pain  Assessment: Faces Faces Pain Scale: Hurts even more Pain Location: Reports neck musculature is very painful when attempting mobility. Pain Descriptors / Indicators: Aching Pain Intervention(s): Monitored during session;Limited activity within patient's tolerance    Home Living Family/patient expects to be discharged to:: Private residence Living Arrangements: Spouse/significant other Available Help at Discharge: Family;Available 24 hours/day Type of Home: House Home Access: Stairs to enter Entrance Stairs-Rails: Left Entrance Stairs-Number of Steps: 4 Home Layout: One level Home Equipment: Walker - 2 wheels;Wheelchair - manual      Prior Function Level of Independence: Independent         Comments: Reports household ambulation only     Hand Dominance        Extremity/Trunk Assessment   Upper Extremity Assessment Upper Extremity Assessment: Generalized weakness(Pt experiences sudden loss of control of wrists when weight bearing)    Lower Extremity Assessment Lower Extremity Assessment: Overall WFL for tasks assessed    Cervical / Trunk Assessment Cervical / Trunk Assessment: Kyphotic  Communication   Communication: No difficulties  Cognition Arousal/Alertness: Awake/alert Behavior During Therapy: Restless;Agitated Overall Cognitive Status: Within Functional Limits for tasks assessed  General Comments      Exercises     Assessment/Plan    PT Assessment Patient needs continued PT services  PT Problem List Decreased strength;Decreased mobility;Decreased activity tolerance;Decreased balance;Pain       PT Treatment Interventions DME instruction;Functional mobility training;Balance training;Patient/family education;Gait training;Therapeutic activities;Stair training;Therapeutic exercise;Neuromuscular re-education;Cognitive remediation;Wheelchair mobility training    PT Goals (Current goals can be found in  the Care Plan section)  Acute Rehab PT Goals Patient Stated Goal: To return home PT Goal Formulation: With patient Time For Goal Achievement: 12/01/18 Potential to Achieve Goals: Fair    Frequency Min 2X/week   Barriers to discharge        Co-evaluation               AM-PAC PT "6 Clicks" Mobility  Outcome Measure Help needed turning from your back to your side while in a flat bed without using bedrails?: None Help needed moving from lying on your back to sitting on the side of a flat bed without using bedrails?: A Little Help needed moving to and from a bed to a chair (including a wheelchair)?: A Lot Help needed standing up from a chair using your arms (e.g., wheelchair or bedside chair)?: A Lot Help needed to walk in hospital room?: Total Help needed climbing 3-5 steps with a railing? : Total 6 Click Score: 13    End of Session Equipment Utilized During Treatment: Gait belt Activity Tolerance: Patient limited by fatigue;Treatment limited secondary to agitation Patient left: in bed;with call bell/phone within reach;with bed alarm set Nurse Communication: Mobility status PT Visit Diagnosis: Unsteadiness on feet (R26.81);Muscle weakness (generalized) (M62.81)    Time: 7425-9563 PT Time Calculation (min) (ACUTE ONLY): 22 min   Charges:   PT Evaluation $PT Eval Moderate Complexity: 1 Mod          Roxanne Gates, PT, DPT   Roxanne Gates 11/17/2018, 4:06 PM

## 2018-11-17 NOTE — ED Notes (Signed)
Pt transported to room 123 

## 2018-11-18 LAB — BASIC METABOLIC PANEL
Anion gap: 3 — ABNORMAL LOW (ref 5–15)
BUN: 49 mg/dL — AB (ref 8–23)
CO2: 24 mmol/L (ref 22–32)
Calcium: 7.6 mg/dL — ABNORMAL LOW (ref 8.9–10.3)
Chloride: 112 mmol/L — ABNORMAL HIGH (ref 98–111)
Creatinine, Ser: 1.24 mg/dL (ref 0.61–1.24)
GFR calc Af Amer: >60 mL/min (ref >60)
GFR calc non Af Amer: 54 mL/min — ABNORMAL LOW (ref >60)
Glucose, Bld: 97 mg/dL (ref 70–99)
POTASSIUM: 5.8 mmol/L — AB (ref 3.5–5.1)
Sodium: 139 mmol/L (ref 135–145)

## 2018-11-18 LAB — CBC
HCT: 30.5 % — ABNORMAL LOW (ref 39.0–52.0)
HEMOGLOBIN: 9.3 g/dL — AB (ref 13.0–17.0)
MCH: 27.8 pg (ref 26.0–34.0)
MCHC: 30.5 g/dL (ref 30.0–36.0)
MCV: 91 fL (ref 80.0–100.0)
Platelets: 225 10*3/uL (ref 150–400)
RBC: 3.35 MIL/uL — AB (ref 4.22–5.81)
RDW: 13 % (ref 11.5–15.5)
WBC: 9.8 10*3/uL (ref 4.0–10.5)
nRBC: 0 % (ref 0.0–0.2)

## 2018-11-18 LAB — TSH: TSH: 6.845 u[IU]/mL — ABNORMAL HIGH (ref 0.350–4.500)

## 2018-11-18 MED ORDER — SERTRALINE HCL 50 MG PO TABS
100.0000 mg | ORAL_TABLET | Freq: Every day | ORAL | Status: DC
  Administered 2018-11-18 – 2018-11-19 (×2): 100 mg via ORAL
  Filled 2018-11-18 (×2): qty 2

## 2018-11-18 MED ORDER — LEVOTHYROXINE SODIUM 25 MCG PO TABS
225.0000 ug | ORAL_TABLET | Freq: Every day | ORAL | Status: DC
  Administered 2018-11-18: 225 ug via ORAL
  Filled 2018-11-18: qty 1

## 2018-11-18 MED ORDER — LEVOTHYROXINE SODIUM 200 MCG PO TABS
200.0000 ug | ORAL_TABLET | Freq: Every day | ORAL | Status: DC
  Administered 2018-11-19: 200 ug via ORAL
  Filled 2018-11-18: qty 1

## 2018-11-18 MED ORDER — ASPIRIN EC 81 MG PO TBEC
81.0000 mg | DELAYED_RELEASE_TABLET | Freq: Every day | ORAL | Status: DC
  Administered 2018-11-18 – 2018-11-19 (×2): 81 mg via ORAL
  Filled 2018-11-18 (×2): qty 1

## 2018-11-18 MED ORDER — PATIROMER SORBITEX CALCIUM 8.4 G PO PACK
8.4000 g | PACK | Freq: Once | ORAL | Status: AC
  Administered 2018-11-18: 8.4 g via ORAL
  Filled 2018-11-18: qty 1

## 2018-11-18 MED ORDER — FERROUS SULFATE 325 (65 FE) MG PO TABS
325.0000 mg | ORAL_TABLET | Freq: Two times a day (BID) | ORAL | Status: DC
  Administered 2018-11-18 – 2018-11-19 (×2): 325 mg via ORAL
  Filled 2018-11-18 (×2): qty 1

## 2018-11-18 MED ORDER — SERTRALINE HCL 25 MG PO TABS
25.0000 mg | ORAL_TABLET | Freq: Every day | ORAL | Status: DC
  Administered 2018-11-18 – 2018-11-19 (×2): 25 mg via ORAL
  Filled 2018-11-18 (×2): qty 1

## 2018-11-18 MED ORDER — GABAPENTIN 300 MG PO CAPS
900.0000 mg | ORAL_CAPSULE | Freq: Three times a day (TID) | ORAL | Status: DC
  Administered 2018-11-18 – 2018-11-19 (×3): 900 mg via ORAL
  Filled 2018-11-18 (×3): qty 3

## 2018-11-18 MED ORDER — SIMVASTATIN 20 MG PO TABS
20.0000 mg | ORAL_TABLET | Freq: Every day | ORAL | Status: DC
  Administered 2018-11-18: 21:00:00 20 mg via ORAL
  Filled 2018-11-18: qty 1

## 2018-11-18 MED ORDER — PANTOPRAZOLE SODIUM 40 MG PO TBEC
40.0000 mg | DELAYED_RELEASE_TABLET | Freq: Two times a day (BID) | ORAL | Status: DC
  Administered 2018-11-18 – 2018-11-19 (×3): 40 mg via ORAL
  Filled 2018-11-18 (×3): qty 1

## 2018-11-18 MED ORDER — ALBUTEROL SULFATE (2.5 MG/3ML) 0.083% IN NEBU: 2.5000 mg | INHALATION_SOLUTION | Freq: Four times a day (QID) | RESPIRATORY_TRACT | Status: DC | PRN

## 2018-11-18 MED ORDER — LEVOTHYROXINE SODIUM 25 MCG PO TABS
25.0000 ug | ORAL_TABLET | Freq: Every day | ORAL | Status: DC
  Administered 2018-11-19: 06:00:00 25 ug via ORAL
  Filled 2018-11-18: qty 1

## 2018-11-18 MED ORDER — ALBUTEROL SULFATE HFA 108 (90 BASE) MCG/ACT IN AERS: 2.0000 | INHALATION_SPRAY | Freq: Four times a day (QID) | RESPIRATORY_TRACT | Status: DC | PRN

## 2018-11-18 NOTE — Progress Notes (Signed)
[facility] [place] NAME: Samuel Ortiz    MR#:  [id number]  DATE OF BIRTH:  1934/10/15  SUBJECTIVE:  CHIEF COMPLAINT:   Chief Complaint  Patient presents with  . Weakness   Brought in with significant weakness by family.  Noted to have pneumonia.  Patient feels slightly better today. REVIEW OF SYSTEMS:  CONSTITUTIONAL: No fever, have fatigue or weakness.  EYES: No blurred or double vision.  EARS, NOSE, AND THROAT: No tinnitus or ear pain.  RESPIRATORY: No cough, some shortness of breath, no wheezing or hemoptysis.  CARDIOVASCULAR: No chest pain, orthopnea, edema.  GASTROINTESTINAL: No nausea, vomiting, diarrhea or abdominal pain.  GENITOURINARY: No dysuria, hematuria.  ENDOCRINE: No polyuria, nocturia,  HEMATOLOGY: No anemia, easy bruising or bleeding SKIN: No rash or lesion. MUSCULOSKELETAL: No joint pain or arthritis.   NEUROLOGIC: No tingling, numbness, weakness.  PSYCHIATRY: No anxiety or depression.   ROS  DRUG ALLERGIES:   Allergies  Allergen Reactions  . Morphine And Related Other (See Comments)    Reaction:  Hallucinations     VITALS:  Blood pressure 119/63, pulse (!) 47, temperature (!) 97.5 F (36.4 C), temperature source Oral, resp. rate 16, height 6' (1.829 m), weight 93 kg, SpO2 99 %.  PHYSICAL EXAMINATION:  GENERAL:  85 y.o.-year-old patient lying in the bed with no acute distress.  EYES: Pupils equal, round, reactive to light and accommodation. No scleral icterus. Extraocular muscles intact.  HEENT: Head atraumatic, normocephalic. Oropharynx and nasopharynx clear.  NECK:  Supple, no jugular venous distention. No thyroid enlargement, no tenderness.  LUNGS: Normal breath sounds bilaterally, no wheezing, some crepitation. No use of accessory muscles of respiration.  CARDIOVASCULAR: S1, S2 normal. No murmurs, rubs, or gallops.  ABDOMEN: Soft, nontender, nondistended. Bowel sounds present. No organomegaly or  mass.  EXTREMITIES: No pedal edema, cyanosis, or clubbing.  NEUROLOGIC: Cranial nerves II through XII are intact. Muscle strength 4/5 in all extremities. Sensation intact. Gait not checked.  PSYCHIATRIC: The patient is alert and oriented x 3.  SKIN: No obvious rash, lesion, or ulcer.   Physical Exam LABORATORY PANEL:   CBC Recent Labs  Lab 11/18/18 0427  WBC 9.8  HGB 9.3*  HCT 30.5*  PLT 225   ------------------------------------------------------------------------------------------------------------------  Chemistries  Recent Labs  Lab 11/17/18 1024 11/18/18 0427  NA 137 139  K 5.2* 5.8*  CL 107 112*  CO2 25 24  GLUCOSE 106* 97  BUN 66* 49*  CREATININE 1.72* 1.24  CALCIUM 7.7* 7.6*  AST 11*  --   ALT 8  --   ALKPHOS 17*  --   BILITOT 0.6  --    ------------------------------------------------------------------------------------------------------------------  Cardiac Enzymes No results for input(s): TROPONINI in the last 168 hours. ------------------------------------------------------------------------------------------------------------------  RADIOLOGY:  Dg Chest 2 View  Result Date: 11/17/2018 CLINICAL DATA:  Weakness EXAM: CHEST - 2 VIEW COMPARISON:  04/03/2016 FINDINGS: Cardiac shadow is stable. Postsurgical changes are again seen. Aortic calcifications are again noted. Bilateral healed rib fractures are noted. The lungs are well aerated bilaterally. Interval clearing of previously seen right basilar infiltrate is seen. Patchy left basilar changes are now seen. No sizable effusion is noted. IMPRESSION: New left basilar infiltrate. Electronically Signed   By: Inez Catalina M.D.   On: 11/17/2018 10:27   Ct Head Wo Contrast  Result Date: 11/17/2018 CLINICAL DATA:  Weakness EXAM: CT HEAD WITHOUT CONTRAST TECHNIQUE: Contiguous axial images were obtained from the base of the skull through the vertex  without intravenous contrast. COMPARISON:  None. FINDINGS: Brain:  Generalized atrophic changes are noted. No findings to suggest acute hemorrhage, acute infarction or space-occupying mass lesion are seen. Vascular: No hyperdense vessel or unexpected calcification. Skull: Normal. Negative for fracture or focal lesion. Sinuses/Orbits: Orbits and their contents are within normal limits. Opacification of the left maxillary antrum is noted and appears chronic in nature. No acute sinus abnormality is seen. Other: None. IMPRESSION: Mild atrophic changes. Chronic changes in the left maxillary antrum. No other focal abnormality is noted. Electronically Signed   By: Inez Catalina M.D.   On: 11/17/2018 10:14    ASSESSMENT AND PLAN:   Active Problems:   Sepsis ([place])  Samuel Ortiz  is a 85 y.o. 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 Patient felt very weak. He was not able to urinate in the urinal according to the wife and was weak thereby came to the emergency room.  1. Sepsis secondary to left lower lobe pneumonia -patient presented with weakness generalized along with hypotension and elevated white count with positive chest x-ray.  -IV Rocephin and Zithromax -follow blood culture -follow-up CBC -IV fluids -Improving.  2. Hypotension due to number one -continue IV fluids. Patient is responding to IV fluids. Currently asymptomatic  3. History of postherpetic neuralgia with chronic narcotic dependence -holding off narcotics since blood pressure was bit on the lower side -resume home meds once medication list has been brought in by wife  4. DVT prophylaxis subcu Lovenox  5. Mild renal insufficiency continue IV fluids  6. Leukocytosis due to number one  7.  Hyperkalemia    Give Lokelma and recheck tomorrow.  8.   Generalized weakness Seen by physical therapy and suggested rehab placement. Patient feels he is much stronger today and refusing to go to rehab. I have instructed nurses to ambulate him in the room and keep checking for his progress.  We may have to arrange for home health on discharge.  All the records are reviewed and case discussed with Care Management/Social Workerr. Management plans discussed with the patient, family and they are in agreement.  CODE STATUS: DNR.  TOTAL TIME TAKING CARE OF THIS PATIENT: 35 minutes.   Called patient's home number to talk to his wife but could not speak and left a voicemail to her.  POSSIBLE D/C IN 1-2 DAYS, DEPENDING ON CLINICAL CONDITION.   Vaughan Basta M.D on 11/18/2018   Between 7am to 6pm - Pager - [phone]  After 6pm go to [email or web address] - password [email or web address] [facility]  Office  [phone]  CC: Primary care physician; Sherrin Daisy, MD  Note: This dictation was prepared with Dragon dictation along with smaller phrase technology. Any transcriptional errors that result from this process are unintentional.

## 2018-11-18 NOTE — Plan of Care (Signed)
  Problem: Education: Goal: Knowledge of General Education information will improve Description Including pain rating scale, medication(s)/side effects and non-pharmacologic comfort measures Outcome: Progressing   Problem: Health Behavior/Discharge Planning: Goal: Ability to manage health-related needs will improve Outcome: Progressing   Problem: Clinical Measurements: Goal: Ability to maintain clinical measurements within normal limits will improve Outcome: Progressing   Problem: Activity: Goal: Risk for activity intolerance will decrease Outcome: Progressing   Problem: Activity: Goal: Ability to tolerate increased activity will improve Outcome: Progressing   Problem: Clinical Measurements: Goal: Ability to maintain a body temperature in the normal range will improve Outcome: Progressing

## 2018-11-18 NOTE — Clinical Social Work Note (Signed)
CSW consulted for SNF placement. Patient is adamantly refusing SNF at this time and would not allow CSW to complete assessment or send bed search. RNCM aware. CSW signing off. Please re consult if further needs arise.   [place], [place]

## 2018-11-18 NOTE — Care Management Note (Addendum)
Case Management Note  Patient Details  Name: Samuel Ortiz MRN: [id number] Date of Birth: [birth year]  Subjective/Objective:     Admitted to [facility]  with the diagnosis of sepsis. Lives with wife, [name] [phone]). States he was seen at [facility] in [place] a couple of months ago. Doesn't remember of physician. Chart review states he is seen by Dr, Raechel Ache in [place]. Prescriptions are filled at [facility] in [facility] per [facility] 08/2016. Skilled nursing at [facility] 03/2016. No home oxygen. Cane, wheelchair, and rolling walker in the home. No home oxygen.  Outpatient palliative in the past. Takes care of all basic activities of daily living himself, doesn't drive. No falls. Fair-good appetite. Wife will transport.             Action/Plan: Physical therapy is recommending skilled nursing facility. Declines this plan at this time. Agrees to home with home health.  [place] query. A copy given to [name]. A copy placed on chart.  Chose [facility].  Will update Sharmon Revere, [facility] representative. Declining to transfer to [facility] at this time. Signed forms faxed to [facility].  Possible discharge 11/19/18 per Dr. Anselm Jungling   Expected Discharge Date:                  Expected Discharge Plan:     In-House Referral:   yes  Discharge planning Services   yes  Post Acute Care Choice:   yes Choice offered to:   patient  DME Arranged:    DME Agency:     HH Arranged:   yes HH Agency:   [facility]  Status of Service:     If discussed at [place] of Stay Meetings, dates discussed:    Additional Comments:  Shelbie Ammons, RN MSN CCM Care Management [phone] 11/18/2018, 10:56 AM

## 2018-11-19 LAB — CREATININE, SERUM
Creatinine, Ser: 0.98 mg/dL (ref 0.61–1.24)
GFR calc Af Amer: >60 mL/min (ref >60)
GFR calc non Af Amer: >60 mL/min (ref >60)

## 2018-11-19 LAB — POTASSIUM: Potassium: 5 mmol/L (ref 3.5–5.1)

## 2018-11-19 MED ORDER — AZITHROMYCIN 250 MG PO TABS: 250.0000 mg | ORAL_TABLET | Freq: Every day | ORAL | 0 refills | Status: AC

## 2018-11-19 MED ORDER — CEFUROXIME AXETIL 250 MG PO TABS: 250.0000 mg | ORAL_TABLET | Freq: Two times a day (BID) | ORAL | 0 refills | Status: AC

## 2018-11-19 NOTE — Progress Notes (Signed)
Reviewed AVA with pt. Pt verbalized understanding . 2 printed rx with avs

## 2018-11-19 NOTE — Progress Notes (Signed)
Patient's potassium 5.8, on prior shift, reports he has not had a bowel movement after veltassa medication given. Notified Dr. Marcille Blanco on call, received verbal order for an EKG. No new orders received after EKG.

## 2018-11-19 NOTE — Care Management Note (Signed)
Case Management Note  Patient Details  Name: Samuel Ortiz MRN: [id number] Date of Birth: [birth year]  Subjective/Objective:                    Action/Plan: Patient Discharging today, Malachy Mood with [facility] aware.  Previous RNCM set up [facility].    Expected Discharge Date:  11/19/18               Expected Discharge Plan:  [place]  In-House Referral:     Discharge planning Services  CM Consult  Post Acute Care Choice:    Choice offered to:     DME Arranged:    DME Agency:     HH Arranged:  RN, PT HH Agency:  [place]  Status of Service:  Completed, signed off  If discussed at [place] of Stay Meetings, dates discussed:    Additional Comments:  Elza Rafter, RN 11/19/2018, 12:57 PM

## 2018-11-19 NOTE — Progress Notes (Signed)
PT Cancellation Note  Patient Details Name: Samuel Ortiz MRN: [id number] DOB: [birth year]   Cancelled Treatment:    Reason Eval/Treat Not Completed: Other (comment)   Discharge orders noted.  Pt approached but refused interventions at this time.   Stated he has been up with nursing staff in room.  Encouraged ambulation on unit per MD note but he refused stating "The only walking I am doing it to the wheelchair and to the car."  Continued to declined intervention and focused on discharge home.  Discussed with RN.  Chesley Noon 11/19/2018, 11:04 AM

## 2018-11-19 NOTE — Discharge Summary (Signed)
[facility] NAME: Samuel Ortiz    MR#:  [id number]  DATE OF BIRTH:  May 18, 1934  DATE OF ADMISSION:  11/17/2018 ADMITTING PHYSICIAN: Fritzi Mandes, MD  DATE OF DISCHARGE: 11/19/2018   PRIMARY CARE PHYSICIAN: Sherrin Daisy, MD    ADMISSION DIAGNOSIS:  Pneumonia of left lower lobe due to infectious organism ([place]) [J18.1] Sepsis, due to unspecified organism, unspecified whether acute organ dysfunction present ([place]) [A41.9]  DISCHARGE DIAGNOSIS:  Active Problems:   Sepsis ([place])   SECONDARY DIAGNOSIS:   Past Medical History:  Diagnosis Date  . Arthritis   . Cancer ([place])   . Clostridium difficile infection   . Depression   . GERD (gastroesophageal reflux disease)   . Hypercholesterolemia   . Hypertension   . Hypothyroidism   . Postherpetic neuralgia   . Shingles   . 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 Patient felt very weak. He was not able to urinate in the urinal according to the wife and was weak thereby came to the emergency room.  1.Sepsis secondary to left lower lobe pneumonia -patient presented with weakness generalized along with hypotension and elevated white count with positive chest x-ray.  -IV Rocephin and Zithromax -follow blood culture -follow-up CBC -IV fluids -Improving.  Will give oral Abx on discharge,  2.Hypotension due to number one -continue IV fluids. Patient is responding to IV fluids. Currently asymptomatic  BP improved. Advise to cont coreg, but stop lisinopril.  3.History of postherpetic neuralgia with chronic narcotic dependence -holding off narcotics since  blood pressure was bit on the lower side -resume home meds   4.DVT prophylaxis subcu Lovenox  5.Mild renal insufficiency continue IV fluids  6.Leukocytosis due to number one  7.  Hyperkalemia    Give Lokelma and recheck tomorrow.  Came down to 5.  8.  Generalized weakness Seen by physical therapy and suggested rehab placement. Patient feels he is much stronger today and refusing to go to rehab. He was able to ambulate with a walker in and out of room , will need home health.  DISCHARGE CONDITIONS:  Stable.  CONSULTS OBTAINED:    DRUG ALLERGIES:   Allergies  Allergen Reactions  . Morphine And Related Other (See Comments)    Reaction:  Hallucinations     DISCHARGE MEDICATIONS:   Allergies as of 11/19/2018      Reactions   Morphine And Related Other (See Comments)   Reaction:  Hallucinations       Medication List    STOP taking these medications   lisinopril 10 MG tablet Commonly known as:  PRINIVIL,ZESTRIL   vancomycin 50 mg/mL  oral solution Commonly known as:  VANCOCIN     TAKE these medications   acidophilus Caps capsule Take 2 capsules by mouth daily.   albuterol 108 (90 Base) MCG/ACT inhaler Commonly known as:  PROVENTIL HFA;VENTOLIN HFA Inhale 2 puffs into the lungs every 6 (six) hours as needed for wheezing or shortness of breath.   aspirin EC 81 MG tablet Take 81 mg by mouth daily.   azithromycin 250 MG tablet Commonly known as:  ZITHROMAX Take 1 tablet (250 mg total) by mouth daily for 3 days.  Start taking on:  November 20, 2018   carvedilol 3.125 MG tablet Commonly known as:  COREG Take 3.125 mg by mouth 2 (two) times daily with a meal. Reported on 04/07/2016   cefUROXime 250 MG tablet Commonly known as:  CEFTIN Take 1 tablet (250 mg total) by mouth 2 (two) times daily for 3 days.   feeding supplement (ENSURE ENLIVE) Liqd Take 237 mLs by mouth 3 (three) times daily between meals.   ferrous sulfate 325 (65 FE) MG tablet Take  325 mg by mouth.   gabapentin 300 MG capsule Commonly known as:  NEURONTIN Take 900 mg by mouth 3 (three) times daily.   HYDROcodone-acetaminophen 5-325 MG tablet Commonly known as:  NORCO/VICODIN Take 1 tablet by mouth every 6 (six) hours as needed for moderate pain. What changed:  when to take this   oxyCODONE 5 MG immediate release tablet Commonly known as:  Oxy IR/ROXICODONE Take 1-2 tablets (5-10 mg total) by mouth every 4 (four) hours as needed for breakthrough pain.   pantoprazole 40 MG tablet Commonly known as:  PROTONIX Take 1 tablet (40 mg total) by mouth 2 (two) times daily.   sertraline 100 MG tablet Commonly known as:  ZOLOFT Take 100 mg by mouth daily.   sertraline 25 MG tablet Commonly known as:  ZOLOFT Take 25 mg by mouth daily.   simvastatin 20 MG tablet Commonly known as:  ZOCOR Take 20 mg by mouth at bedtime.   SYNTHROID 25 MCG tablet Generic drug:  levothyroxine Take 25 mcg by mouth daily before breakfast.   levothyroxine 100 MCG tablet Commonly known as:  SYNTHROID, LEVOTHROID Take 200 mcg by mouth daily before breakfast.        DISCHARGE INSTRUCTIONS:    Follow with PMD in 1 week.  If you experience worsening of your admission symptoms, develop shortness of breath, life threatening emergency, suicidal or homicidal thoughts you must seek medical attention immediately by calling 911 or calling your MD immediately  if symptoms less severe.  You Must read complete instructions/literature along with all the possible adverse reactions/side effects for all the Medicines you take and that have been prescribed to you. Take any new Medicines after you have completely understood and accept all the possible adverse reactions/side effects.   Please note  You were cared for by a hospitalist during your hospital stay. If you have any questions about your discharge medications or the care you received while you were in the hospital after you are discharged,  you can call the unit and asked to speak with the hospitalist on call if the hospitalist that took care of you is not available. Once you are discharged, your primary care physician will handle any further medical issues. Please note that NO REFILLS for any discharge medications will be authorized once you are discharged, as it is imperative that you return to your primary care physician (or establish a relationship with a primary care physician if you do not have one) for your aftercare needs so that they can reassess your need for medications and monitor your lab values.    Today   CHIEF COMPLAINT:   Chief Complaint  Patient presents with  . Weakness    HISTORY OF PRESENT ILLNESS:  Samuel Ortiz  is a 85 y.o. 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 Patient felt very weak. He was not able to urinate in the urinal according to the wife and was weak thereby came to the emergency room. He was found to be hypotensive received IV fluids blood pressure is 96/62 during my evaluation. Patient denies any cough or fever. In the ER he was found to have elevated white count of 21.2. Chest x-ray showed left lower lobe infiltrate. Patient received IV Rocephin IV Zithromax and IV fluids. He is currently hemodynamically stable being admitted for community acquired pneumonia   VITAL SIGNS:  Blood pressure (!) 139/58, pulse 66, temperature 98.1 F (36.7 C), temperature source Oral, resp. rate 18, height 6' (1.829 m), weight 93 kg, SpO2 91 %.  I/O:    Intake/Output Summary (Last 24 hours) at 11/19/2018 1042 Last data filed at 11/19/2018 0753 Gross per 24 hour  Intake 120 ml  Output 600 ml  Net -480 ml    PHYSICAL EXAMINATION:  GENERAL:  85 y.o.-year-old patient  lying in the bed with no acute distress.  EYES: Pupils equal, round, reactive to light and accommodation. No scleral icterus. Extraocular muscles intact.  HEENT: Head atraumatic, normocephalic. Oropharynx and nasopharynx clear.  NECK:  Supple, no jugular venous distention. No thyroid enlargement, no tenderness.  LUNGS: Normal breath sounds bilaterally, no wheezing, rales,rhonchi or crepitation. No use of accessory muscles of respiration.  CARDIOVASCULAR: S1, S2 normal. No murmurs, rubs, or gallops.  ABDOMEN: Soft, non-tender, non-distended. Bowel sounds present. No organomegaly or mass.  EXTREMITIES: No pedal edema, cyanosis, or clubbing.  NEUROLOGIC: Cranial nerves II through XII are intact. Muscle strength 5/5 in all extremities. Sensation intact. Gait not checked.  PSYCHIATRIC: The patient is alert and oriented x 3.  SKIN: No obvious rash, lesion, or ulcer.   DATA REVIEW:   CBC Recent Labs  Lab 11/18/18 0427  WBC 9.8  HGB 9.3*  HCT 30.5*  PLT 225    Chemistries  Recent Labs  Lab 11/17/18 1024 11/18/18 0427 11/19/18 0538  NA 137 139  --   K 5.2* 5.8* 5.0  CL 107 112*  --   CO2 25 24  --   GLUCOSE 106* 97  --   BUN 66* 49*  --   CREATININE 1.72* 1.24 0.98  CALCIUM 7.7* 7.6*  --   AST 11*  --   --   ALT 8  --   --   ALKPHOS 17*  --   --   BILITOT 0.6  --   --     Cardiac Enzymes No results for input(s): TROPONINI in the last 168 hours.  Microbiology Results  Results for orders placed or performed during the hospital encounter of 11/17/18  Blood Culture (routine x 2)     Status: None (Preliminary result)   Collection Time: 11/17/18 11:36 AM  Result Value Ref Range Status   Specimen Description BLOOD RIGHT ANTECUBITAL  Final   Special Requests   Final    BOTTLES DRAWN AEROBIC AND ANAEROBIC Blood Culture adequate volume   Culture   Final    NO GROWTH 2 DAYS Performed at [facility], [address], [place], [place] [address]    Report Status PENDING   Incomplete  Blood Culture (routine x 2)     Status: None (Preliminary result)   Collection Time: 11/17/18 11:59 AM  Result Value Ref Range Status   Specimen Description BLOOD LEFT HAND  Final   Special Requests   Final  BOTTLES DRAWN AEROBIC AND ANAEROBIC Blood Culture results may not be optimal due to an inadequate volume of blood received in culture bottles   Culture   Final    NO GROWTH 2 DAYS Performed at [facility], [address] [place], [place] [address]    Report Status PENDING  Incomplete    RADIOLOGY:  No results found.  EKG:   Orders placed or performed during the hospital encounter of 11/17/18  . ED EKG  . ED EKG  . EKG 12-Lead  . EKG 12-Lead  . ED EKG 12-Lead  . ED EKG 12-Lead  . EKG 12-Lead  . EKG 12-Lead      Management plans discussed with the patient, family and they are in agreement.  CODE STATUS:     Code Status Orders  (From admission, onward)         Start     Ordered   11/17/18 1258  Do not attempt resuscitation (DNR)  Continuous    Question Answer Comment  In the event of cardiac or respiratory ARREST Do not call a "code blue"   In the event of cardiac or respiratory ARREST Do not perform Intubation, CPR, defibrillation or ACLS   In the event of cardiac or respiratory ARREST Use medication by any route, position, wound care, and other measures to relive pain and suffering. May use oxygen, suction and manual treatment of airway obstruction as needed for comfort.      11/17/18 1257        Code Status History    Date Active Date Inactive Code Status Order ID Comments User Context   09/08/2016 1445 09/09/2016 1800 DNR [id number]  Corky Mull, MD Inpatient   04/03/2016 2222 04/09/2016 1827 DNR [id number]  Quintella Baton, MD Inpatient      TOTAL TIME TAKING CARE OF THIS PATIENT:35  minutes.    Vaughan Basta M.D on 11/19/2018 at 10:42 AM  Between 7am to 6pm - Pager - [phone]  After 6pm go to [email or web address] -  password [email or web address] [facility]  Office  [phone]  CC: Primary care physician; Sherrin Daisy, MD   Note: This dictation was prepared with Dragon dictation along with smaller phrase technology. Any transcriptional errors that result from this process are unintentional.

## 2018-11-22 ENCOUNTER — Emergency Department
Admission: EM | Admit: 2018-11-22 | Discharge: 2018-11-22 | Disposition: A | Discharge status: Home / Self Care | Payer: Medicare Other | Attending: Emergency Medicine | Admitting: Emergency Medicine

## 2018-11-22 ENCOUNTER — Emergency Department: Payer: Medicare Other

## 2018-11-22 ENCOUNTER — Other Ambulatory Visit: Payer: Self-pay

## 2018-11-22 ENCOUNTER — Encounter: Payer: Self-pay | Admitting: Emergency Medicine

## 2018-11-22 DIAGNOSIS — E039 Hypothyroidism, unspecified: Secondary | ICD-10-CM | POA: Insufficient documentation

## 2018-11-22 DIAGNOSIS — Z85818 Personal history of malignant neoplasm of other sites of lip, oral cavity, and pharynx: Secondary | ICD-10-CM | POA: Insufficient documentation

## 2018-11-22 DIAGNOSIS — I10 Essential (primary) hypertension: Secondary | ICD-10-CM | POA: Diagnosis not present

## 2018-11-22 DIAGNOSIS — Z96611 Presence of right artificial shoulder joint: Secondary | ICD-10-CM | POA: Diagnosis not present

## 2018-11-22 DIAGNOSIS — Z87891 Personal history of nicotine dependence: Secondary | ICD-10-CM | POA: Insufficient documentation

## 2018-11-22 DIAGNOSIS — J449 Chronic obstructive pulmonary disease, unspecified: Secondary | ICD-10-CM | POA: Insufficient documentation

## 2018-11-22 DIAGNOSIS — Z951 Presence of aortocoronary bypass graft: Secondary | ICD-10-CM | POA: Diagnosis not present

## 2018-11-22 DIAGNOSIS — M79662 Pain in left lower leg: Secondary | ICD-10-CM | POA: Insufficient documentation

## 2018-11-22 DIAGNOSIS — M79605 Pain in left leg: Secondary | ICD-10-CM

## 2018-11-22 DIAGNOSIS — R531 Weakness: Secondary | ICD-10-CM

## 2018-11-22 LAB — CULTURE, BLOOD (ROUTINE X 2)
Culture: NO GROWTH
Culture: NO GROWTH
Special Requests: ADEQUATE

## 2018-11-22 LAB — CBC WITH DIFFERENTIAL/PLATELET
Abs Immature Granulocytes: 0.05 10*3/uL (ref 0.00–0.07)
Basophils Absolute: 0 10*3/uL (ref 0.0–0.1)
Basophils Relative: 0 %
Eosinophils Absolute: 0.2 10*3/uL (ref 0.0–0.5)
Eosinophils Relative: 2 %
HCT: 28.1 % — ABNORMAL LOW (ref 39.0–52.0)
HEMOGLOBIN: 8.8 g/dL — AB (ref 13.0–17.0)
Immature Granulocytes: 1 %
Lymphocytes Relative: 12 %
Lymphs Abs: 1.2 10*3/uL (ref 0.7–4.0)
MCH: 28 pg (ref 26.0–34.0)
MCHC: 31.3 g/dL (ref 30.0–36.0)
MCV: 89.5 fL (ref 80.0–100.0)
Monocytes Absolute: 1 10*3/uL (ref 0.1–1.0)
Monocytes Relative: 10 %
Neutro Abs: 7.3 10*3/uL (ref 1.7–7.7)
Neutrophils Relative %: 75 %
Platelets: 272 10*3/uL (ref 150–400)
RBC: 3.14 MIL/uL — AB (ref 4.22–5.81)
RDW: 12.9 % (ref 11.5–15.5)
WBC: 9.7 10*3/uL (ref 4.0–10.5)
nRBC: 0 % (ref 0.0–0.2)

## 2018-11-22 LAB — COMPREHENSIVE METABOLIC PANEL
ALT: 8 U/L (ref 0–44)
AST: 14 U/L — ABNORMAL LOW (ref 15–41)
Albumin: 2.6 g/dL — ABNORMAL LOW (ref 3.5–5.0)
Alkaline Phosphatase: 13 U/L — ABNORMAL LOW (ref 38–126)
Anion gap: 6 (ref 5–15)
BUN: 22 mg/dL (ref 8–23)
CALCIUM: 8 mg/dL — AB (ref 8.9–10.3)
CO2: 27 mmol/L (ref 22–32)
Chloride: 103 mmol/L (ref 98–111)
Creatinine, Ser: 1.01 mg/dL (ref 0.61–1.24)
GFR calc Af Amer: >60 mL/min (ref >60)
GFR calc non Af Amer: >60 mL/min (ref >60)
Glucose, Bld: 96 mg/dL (ref 70–99)
Potassium: 5 mmol/L (ref 3.5–5.1)
Sodium: 136 mmol/L (ref 135–145)
Total Bilirubin: 0.5 mg/dL (ref 0.3–1.2)
Total Protein: 6.5 g/dL (ref 6.5–8.1)

## 2018-11-22 LAB — URIC ACID: Uric Acid, Serum: 5.4 mg/dL (ref 3.7–8.6)

## 2018-11-22 MED ORDER — ACETAMINOPHEN 500 MG PO TABS
1000.0000 mg | ORAL_TABLET | Freq: Once | ORAL | Status: AC
  Administered 2018-11-22: 1000 mg via ORAL
  Filled 2018-11-22: qty 2

## 2018-11-22 NOTE — Care Management Note (Addendum)
Case Management Note  Patient Details  Name: Samuel Ortiz MRN: [id number] Date of Birth: [birth year]  Subjective/Objective:      Patient is being seen in the ED for leg pain.  Patient reports that he came to the ER because he could not walk.  PT worked with patient earlier and he was able to walk.  Patient is being discharged home with home health services.  Patient is already open to [facility], Sharmon Revere contacted and [facility] RN will be out first thing tomorrow morning.  Patient reports that he has a walker and a wheelchair at home.  Patient's wife is providing his transportation home.   Doran Clay RN BSN [phone]               Action/Plan: Discharge home with home health- previously set up on last admission with [facility].   Expected Discharge Date:                  Expected Discharge Plan:  [place]  In-House Referral:  Clinical Social Work  Discharge planning Services  CM Consult  Post Acute Care Choice:  Home Health Choice offered to:  Patient  DME Arranged:    DME Agency:     HH Arranged:  RN, PT HH Agency:  [facility]  Status of Service:  Completed, signed off  If discussed at [place] of Stay Meetings, dates discussed:    Additional Comments:  Shelbie Hutching, RN 11/22/2018, 2:59 PM

## 2018-11-22 NOTE — ED Notes (Signed)
Physical therapy in room at this time.

## 2018-11-22 NOTE — ED Triage Notes (Signed)
Per [facility] patient arrives for ED evaluation of left leg pain. Patient states that while coming to the  Hospitial last week, his leg swung out of the EMS stretcher someone  'fell' onto his leg and now patient c/o pain to left leg with dorsiflexion.  EMS reports that wife stated "patient is acting crazy again".  EMS report that initial O2 Sats were 90-91%, placed on 2L/ [place], sats improved to 98%.  Left medial ankle and calf with some redness noted.  Patient states he gets up at home with aid of a walker and or cane.

## 2018-11-22 NOTE — ED Provider Notes (Signed)
[facility] Emergency Department Provider Note       Time seen: ----------------------------------------- 10:04 AM on 11/22/2018 -----------------------------------------   I have reviewed the triage vital signs and the nursing notes.  HISTORY   Chief Complaint Leg Pain    HPI Samuel Ortiz is a 85 y.o. male with a history of arthritis, cancer, depression, GERD, hyperlipidemia, hypertension, shingles who presents to the ED for left leg pain.  Patient states while coming to the hospital last week his leg was landed on by EMS providers.  Patient reports that he fell on his left leg on 2 separate occasions.  He has pain with dorsiflexion of the left ankle.  EMS reports wife states he is not been acting himself.  He denies any of these complaints, only complaint is left leg pain.  Past Medical History:  Diagnosis Date  . Arthritis   . Cancer ([place])   . Clostridium difficile infection   . Depression   . GERD (gastroesophageal reflux disease)   . Hypercholesterolemia   . Hypertension   . Hypothyroidism   . Postherpetic neuralgia   . Shingles   . Vitamin B 12 deficiency     Patient Active Problem List   Diagnosis Date Noted  . Status post total shoulder replacement, right 09/08/2016  . AKI (acute kidney injury) ([place]) 07/22/2016  . 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 Acute pain of right shoulder 06/02/2016  . Primary osteoarthritis of right shoulder 06/02/2016  . Hypotension 04/03/2016  . Clostridium difficile diarrhea 04/03/2016  . GI bleed 04/03/2016  . Dysphagia 04/03/2016  . Syncope 04/03/2016  . Aspiration pneumonia ([place]) 04/03/2016  . Shingles 04/03/2016  . Post herpetic neuralgia 04/03/2016  . Elevated troponin 04/03/2016  . Hypothyroidism 04/03/2016  . Physical deconditioning 04/03/2016  . History of throat cancer 04/03/2016  . Pneumonia of right lower lobe due to infectious organism ([place]) 03/25/2016  . Severe sepsis ([place]) 03/25/2016   . History of esophageal stricture 03/22/2016  . Community acquired pneumonia 03/21/2016  . Hypertension 03/21/2016  . Sepsis ([place]) 03/21/2016  . COPD, mild ([place]) 09/17/2014  . Back muscle spasm 08/22/2014  . DDD (degenerative disc disease), lumbar 08/21/2014  . Depression 07/19/2014  . Essential hypertension 07/19/2014  . Hyperlipidemia, unspecified 07/19/2014    Past Surgical History:  Procedure Laterality Date  . CORONARY ARTERY BYPASS GRAFT    . FECAL TRANSPLANT    . FRACTURE SURGERY    . orif femur fraction Right   . throat cancer     resection of cancer  . TONSILLECTOMY    . TOTAL SHOULDER ARTHROPLASTY Right 09/08/2016   Procedure: TOTAL SHOULDER ARTHROPLASTY;  Surgeon: Corky Mull, MD;  Location: [facility] ORS;  Service: Orthopedics;  Laterality: Right;    Allergies Morphine and related  Social History Social History   Tobacco Use  . Smoking status: Former Smoker    Last attempt to quit: 03/19/1961    Years since quitting: 57.7  . Smokeless tobacco: Never Used  Substance Use Topics  . Alcohol use: No  . Drug use: No   Review of Systems Constitutional: Negative for fever. Cardiovascular: Negative for chest pain. Respiratory: Negative for shortness of breath. Gastrointestinal: Negative for abdominal pain, vomiting and diarrhea. Musculoskeletal: Positive for left leg pain Skin: Negative for rash. Neurological: Negative for headaches, focal weakness or numbness.  All systems negative/normal/unremarkable except as stated in the HPI  ____________________________________________   PHYSICAL EXAM:  VITAL SIGNS: ED Triage Vitals  Enc Vitals Group  BP 11/22/18 0930 (!) 150/65     Pulse Rate 11/22/18 0930 (!) 58     Resp 11/22/18 0930 16     Temp 11/22/18 0930 98.9 F (37.2 C)     Temp Source 11/22/18 0930 Oral     SpO2 11/22/18 0932 95 %     Weight 11/22/18 0931 205 lb (93 kg)     Height 11/22/18 0931 6' (1.829 m)     Head Circumference --      Peak  Flow --      Pain Score 11/22/18 0931 0     Pain Loc --      Pain Edu? --      Excl. in [place]? --    Constitutional: Alert and oriented. Well appearing and in no distress. Eyes: Conjunctivae are normal. Normal extraocular movements. ENT      Head: Normocephalic and atraumatic.      Nose: No congestion/rhinnorhea.      Mouth/Throat: Mucous membranes are moist.      Neck: No stridor. Cardiovascular: Normal rate, regular rhythm. No murmurs, rubs, or gallops. Respiratory: Normal respiratory effort without tachypnea nor retractions. Breath sounds are clear and equal bilaterally. No wheezes/rales/rhonchi. Gastrointestinal: Soft and nontender. Normal bowel sounds Musculoskeletal: Mild pain with range of motion of left lower extremity, particularly the ankle and knee.  Some left lower extremity edema is noted. Neurologic:  Normal speech and language. No gross focal neurologic deficits are appreciated.  Skin: Mild edema and erythema in the left lower extremity Psychiatric: Mood and affect are normal. Speech and behavior are normal.  ____________________________________________  ED COURSE:  As part of my medical decision making, I reviewed the following data within the [place] History obtained from family if available, nursing notes, old chart and ekg, as well as notes from prior ED visits. Patient presented for left leg pain, we will assess with labs and imaging as indicated at this time.   Procedures ____________________________________________   RADIOLOGY Images were viewed by me  Left tib-fib, left foot x-rays, ultrasound of the left lower extremity  IMPRESSION: No evidence of DVT within the left lower extremity. IMPRESSION: No acute abnormality. Arthritic changes at the left knee.  IMPRESSION: No significant abnormalities of the left foot. ____________________________________________   DIFFERENTIAL DIAGNOSIS   Contusion, fracture, sprain, DVT,  cellulitis  FINAL ASSESSMENT AND PLAN  Left leg pain   Plan: The patient had presented for left leg pain. Patient's labs were reassuring. Patient's imaging not reveal any acute process.  Clinically this resembles either cellulitis developing or gallop.  His main issue is that he cannot walk.  Prior to discharge last time, he declined to go to a rehab facility after this was recommended.  He cannot bear weight at this time on his left leg.  I will discuss with social work and have physical therapy perform an assessment.   Laurence Aly, MD    Note: This note was generated in part or whole with voice recognition software. Voice recognition is usually quite accurate but there are transcription errors that can and very often do occur. I apologize for any typographical errors that were not detected and corrected.     Earleen Newport, MD 11/22/18 1259

## 2018-11-22 NOTE — ED Notes (Signed)
Bedside Xray's completed

## 2018-11-22 NOTE — ED Provider Notes (Signed)
Initially the plan was nursing home placement.  He was able to ambulate with assistance.  He will continue his antibiotics at home as prescribed and we will arrange for home health.   Earleen Newport, MD 11/22/18 1444

## 2018-11-22 NOTE — ED Notes (Signed)
Physical therapist recommended Home Health PT. Will advise Dr. Jimmye Norman.

## 2018-11-22 NOTE — Clinical Social Work Note (Signed)
Clinical Social Worker (CWS) received consult for "Can't walk." CSW staffed with EDP Dr. Jimmye Norman and lead CSW [place]. CSW met with patient at bedside, while physical therapy (P/T) present. CSW explained Medicare unable to pay for short-term rehab unless having a 3 night qualifying stay. Per review of chart, patient only has 2. Patient able to walk and agreeable to go home with home health. Patient unable to recall home health agency assigned previously. CSW updated RNCM Pryor Montes, who is now following patient for discharge needs. CSW signing off as no further social work needs identified.   Oretha Ellis, Latanya Presser, [place] Worker-Emergency Department [phone]

## 2018-11-22 NOTE — ED Notes (Signed)
Report given to Amy C. RN

## 2018-11-22 NOTE — Evaluation (Signed)
Physical Therapy Evaluation Patient Details Name: Samuel Ortiz MRN: [id number] DOB: 12/04/1933 Today's Date: 11/22/2018   History of Present Illness  Pt is an 85 y.o. male presenting to ED with L LE pain (pt reporting L leg landed on by EMS providers about 1 week ago); imaging negative for fx; question developing cellulitis or gout.  PMH includes R TSR, CA, htn, shingles, AKI, h/o throat CA, PNA, CABG, h/o ORIF R femur fx.  Clinical Impression  Prior to hospital admission, pt reports being independent with ambulation.  Pt lives with his wife in 1 level home with 4 steps and L railing to enter.  Currently pt is modified independent with bed mobility; CGA with transfers; and CGA to ambulate 60 feet with RW.  Mildly antalgic gait noted (pt reporting "20/10" L LE pain with ambulation although pain rating did not match facial expressions).  Pt reporting L ankle pain 8/10 with L ankle DF/PF AROM but 0/10 at rest beginning and end of session.  Pt reporting feeling happy that he could walk and that he could go home now.  Pt would benefit from skilled PT to address noted impairments and functional limitations (see below for any additional details).  Upon hospital discharge, recommend pt discharge with HHPT and RW use.    Follow Up Recommendations Home health PT    Equipment Recommendations  Rolling walker with 5" wheels    Recommendations for Other Services       Precautions / Restrictions Precautions Precautions: Fall Restrictions Weight Bearing Restrictions: No      Mobility  Bed Mobility Overal bed mobility: Modified Independent             General bed mobility comments: Semi-supine to/from sit without any noted difficulty  Transfers Overall transfer level: Needs assistance Equipment used: Rolling walker (2 wheeled);None Transfers: Sit to/from Stand Sit to Stand: Min guard         General transfer comment: pt able to stand without any UE support and with RW (both  appearing safe and steady)  Ambulation/Gait Ambulation/Gait assistance: Min guard;+2 safety/equipment Gait Distance (Feet): 60 Feet Assistive device: Rolling walker (2 wheeled)   Gait velocity: decreased mildly   General Gait Details: mild decreased stance time L LE (mildly antalgic); steady with RW use  Stairs            Wheelchair Mobility    Modified Rankin (Stroke Patients Only)       Balance Overall balance assessment: Needs assistance Sitting-balance support: Feet supported;No upper extremity supported Sitting balance-Leahy Scale: Good Sitting balance - Comments: steady sitting reaching within BOS   Standing balance support: No upper extremity supported Standing balance-Leahy Scale: Good Standing balance comment: steady standing reaching within BOS                             Pertinent Vitals/Pain Pain Assessment: 0-10 Pain Score: 0-No pain Pain Location: L LE Pain Intervention(s): Limited activity within patient's tolerance;Monitored during session;Repositioned  HR 44-58 bpm at rest (nurse aware and reports that has been consistent today) but increased up to 74 bpm with activity.    Home Living Family/patient expects to be discharged to:: Private residence Living Arrangements: Spouse/significant other Available Help at Discharge: Family;Available 24 hours/day Type of Home: House Home Access: Stairs to enter Entrance Stairs-Rails: Left Entrance Stairs-Number of Steps: 4 Home Layout: One level Home Equipment: Walker - 2 wheels;Wheelchair - manual;Bedside commode;Cane - single point  Prior Function Level of Independence: Independent               Hand Dominance        Extremity/Trunk Assessment   Upper Extremity Assessment Upper Extremity Assessment: Generalized weakness    Lower Extremity Assessment Lower Extremity Assessment: Generalized weakness(able to perform B LE SLR easily; L DF/PF AROM at least 3/5 (MMT deferred  d/t concerns of causing pain))    Cervical / Trunk Assessment Cervical / Trunk Assessment: Kyphotic  Communication   Communication: No difficulties  Cognition Arousal/Alertness: Awake/alert Behavior During Therapy: Restless Overall Cognitive Status: Within Functional Limits for tasks assessed                                        General Comments   Nursing cleared pt for participation in physical therapy.  Pt agreeable to PT session and eager to try walking with walker.    Exercises     Assessment/Plan    PT Assessment Patient needs continued PT services  PT Problem List Decreased strength;Decreased balance;Decreased mobility;Pain       PT Treatment Interventions DME instruction;Gait training;Stair training;Functional mobility training;Therapeutic activities;Therapeutic exercise;Balance training;Patient/family education    PT Goals (Current goals can be found in the Care Plan section)  Acute Rehab PT Goals Patient Stated Goal: To return home PT Goal Formulation: With patient Time For Goal Achievement: 12/06/18 Potential to Achieve Goals: Good    Frequency Min 2X/week   Barriers to discharge        Co-evaluation               AM-PAC PT "6 Clicks" Mobility  Outcome Measure Help needed turning from your back to your side while in a flat bed without using bedrails?: None Help needed moving from lying on your back to sitting on the side of a flat bed without using bedrails?: None Help needed moving to and from a bed to a chair (including a wheelchair)?: A Little Help needed standing up from a chair using your arms (e.g., wheelchair or bedside chair)?: A Little Help needed to walk in hospital room?: A Little Help needed climbing 3-5 steps with a railing? : A Little 6 Click Score: 20    End of Session Equipment Utilized During Treatment: Gait belt Activity Tolerance: Patient tolerated treatment well Patient left: in bed;with call bell/phone  within reach(B guardrails up; pt educated to call and wait for staff assist prior to getting OOB) Nurse Communication: Mobility status;Other (comment)(Pt's pain status) PT Visit Diagnosis: Other abnormalities of gait and mobility (R26.89);Muscle weakness (generalized) (M62.81);Difficulty in walking, not elsewhere classified (R26.2);Pain Pain - Right/Left: Left Pain - part of body: Ankle and joints of foot    Time: 1410-1426 PT Time Calculation (min) (ACUTE ONLY): 16 min   Charges:   PT Evaluation $PT Eval Low Complexity: 1 Low         Solmon Bohr, PT 11/22/18, 3:50 PM [phone]

## 2018-11-22 NOTE — ED Notes (Signed)
Patient's wife was called and will come to pick up the patient.

## 2018-11-22 NOTE — ED Notes (Signed)
Resumed care from sonjia rn  Pt alert.  Pt waiting on family for discharge.

## 2018-11-29 LAB — BLOOD GAS, VENOUS
PATIENT TEMPERATURE: 37
pCO2, Ven: 54 mmHg (ref 44.0–60.0)
pH, Ven: 7.29 (ref 7.250–7.430)

## 2020-05-13 ENCOUNTER — Other Ambulatory Visit: Payer: Self-pay | Admitting: Gastroenterology

## 2020-05-13 DIAGNOSIS — R1319 Other dysphagia: Secondary | ICD-10-CM

## 2020-05-16 ENCOUNTER — Ambulatory Visit
Admission: RE | Admit: 2020-05-16 | Discharge: 2020-05-16 | Disposition: A | Discharge status: Home / Self Care | Payer: Medicare PPO | Source: Ambulatory Visit | Attending: Gastroenterology | Admitting: Gastroenterology

## 2020-05-16 ENCOUNTER — Other Ambulatory Visit: Payer: Self-pay

## 2020-05-16 DIAGNOSIS — R1310 Dysphagia, unspecified: Secondary | ICD-10-CM | POA: Diagnosis present

## 2020-05-16 DIAGNOSIS — R1319 Other dysphagia: Secondary | ICD-10-CM

## 2020-08-26 DEATH — deceased
